# Patient Record
Sex: Female | Born: 1940 | Race: Black or African American | Hispanic: No | Smoking: Former smoker
Health system: Southern US, Community
[De-identification: ages and names within clinical notes are randomized; demographics above are authoritative.]

## PROBLEM LIST (undated history)

## (undated) DIAGNOSIS — F0390 Unspecified dementia without behavioral disturbance: Secondary | ICD-10-CM

## (undated) DIAGNOSIS — M67919 Unspecified disorder of synovium and tendon, unspecified shoulder: Secondary | ICD-10-CM

## (undated) DIAGNOSIS — R2681 Unsteadiness on feet: Secondary | ICD-10-CM

## (undated) DIAGNOSIS — M1990 Unspecified osteoarthritis, unspecified site: Secondary | ICD-10-CM

## (undated) DIAGNOSIS — E2839 Other primary ovarian failure: Secondary | ICD-10-CM

## (undated) DIAGNOSIS — G8929 Other chronic pain: Secondary | ICD-10-CM

## (undated) DIAGNOSIS — H209 Unspecified iridocyclitis: Secondary | ICD-10-CM

## (undated) DIAGNOSIS — I10 Essential (primary) hypertension: Secondary | ICD-10-CM

## (undated) DIAGNOSIS — E785 Hyperlipidemia, unspecified: Secondary | ICD-10-CM

## (undated) DIAGNOSIS — Z227 Latent tuberculosis: Secondary | ICD-10-CM

## (undated) HISTORY — DX: Unspecified disorder of synovium and tendon, unspecified shoulder: M67.919

## (undated) HISTORY — DX: Other chronic pain: G89.29

## (undated) HISTORY — PX: ABDOMINAL HYSTERECTOMY: SHX81

## (undated) HISTORY — DX: Unspecified dementia, unspecified severity, without behavioral disturbance, psychotic disturbance, mood disturbance, and anxiety: F03.90

## (undated) HISTORY — DX: Unsteadiness on feet: R26.81

## (undated) HISTORY — PX: WRIST SURGERY: SHX841

## (undated) HISTORY — DX: Essential (primary) hypertension: I10

## (undated) HISTORY — DX: Latent tuberculosis: Z22.7

## (undated) HISTORY — DX: Hyperlipidemia, unspecified: E78.5

## (undated) HISTORY — DX: Unspecified iridocyclitis: H20.9

## (undated) HISTORY — DX: Unspecified osteoarthritis, unspecified site: M19.90

## (undated) HISTORY — DX: Other primary ovarian failure: E28.39

---

## 2000-02-22 ENCOUNTER — Encounter: Payer: Self-pay | Admitting: *Deleted

## 2000-02-22 ENCOUNTER — Encounter: Admission: RE | Admit: 2000-02-22 | Discharge: 2000-02-22 | Payer: Self-pay | Admitting: *Deleted

## 2000-09-12 ENCOUNTER — Encounter: Payer: Self-pay | Admitting: *Deleted

## 2000-09-12 ENCOUNTER — Encounter: Admission: RE | Admit: 2000-09-12 | Discharge: 2000-09-12 | Payer: Self-pay | Admitting: *Deleted

## 2002-11-30 ENCOUNTER — Encounter: Payer: Self-pay | Admitting: Internal Medicine

## 2002-11-30 ENCOUNTER — Encounter: Admission: RE | Admit: 2002-11-30 | Discharge: 2002-11-30 | Payer: Self-pay | Admitting: Internal Medicine

## 2003-01-06 ENCOUNTER — Encounter: Admission: RE | Admit: 2003-01-06 | Discharge: 2003-04-06 | Payer: Self-pay | Admitting: Internal Medicine

## 2005-05-22 ENCOUNTER — Ambulatory Visit (HOSPITAL_COMMUNITY): Admission: RE | Admit: 2005-05-22 | Discharge: 2005-05-22 | Payer: Self-pay | Admitting: Gastroenterology

## 2008-02-05 ENCOUNTER — Ambulatory Visit (HOSPITAL_COMMUNITY): Admission: RE | Admit: 2008-02-05 | Discharge: 2008-02-05 | Payer: Self-pay | Admitting: Ophthalmology

## 2008-06-06 ENCOUNTER — Emergency Department (HOSPITAL_COMMUNITY): Admission: EM | Admit: 2008-06-06 | Discharge: 2008-06-06 | Payer: Self-pay | Admitting: Emergency Medicine

## 2009-01-05 ENCOUNTER — Emergency Department (HOSPITAL_COMMUNITY): Admission: EM | Admit: 2009-01-05 | Discharge: 2009-01-05 | Payer: Self-pay | Admitting: Family Medicine

## 2009-06-19 ENCOUNTER — Encounter: Admission: RE | Admit: 2009-06-19 | Discharge: 2009-06-19 | Payer: Self-pay | Admitting: Family Medicine

## 2010-07-25 ENCOUNTER — Encounter: Admission: RE | Admit: 2010-07-25 | Discharge: 2010-07-25 | Payer: Self-pay | Admitting: Internal Medicine

## 2011-01-13 ENCOUNTER — Encounter: Payer: Self-pay | Admitting: Internal Medicine

## 2011-05-10 NOTE — Op Note (Signed)
[name], Sheila Logan       ACCOUNT NO.:  [id number]   MEDICAL RECORD NO.:  [id number]          PATIENT TYPE:  AMB   LOCATION:  ENDO                         FACILITY:  [facility]   PHYSICIAN:  Anselmo Rod, M.D.  DATE OF BIRTH:  04-07-1941   DATE OF PROCEDURE:  05/22/2005  DATE OF DISCHARGE:                                 OPERATIVE REPORT   PROCEDURE PERFORMED:  Screening colonoscopy.   ENDOSCOPIST:  Charna Elizabeth, M.D.   INSTRUMENT USED:  Olympus video colonoscope.   INDICATIONS FOR PROCEDURE:  70 year old African-American female undergoing  screening colonoscopy.  Patient has had history of abdominal pain in the  recent past with worsening constipation and occasional bright red blood per  rectum.  Rule out colonic polyps, masses, etc.   PREPROCEDURE PREPARATION:  Informed consent was procured from the patient.  The patient was fasted for eight hours prior to the procedure and prepped  with a bottle of magnesium citrate and a gallon of GoLYTELY the night prior  to the procedure.  The risks and benefits of the procedure including a 10%  missed rate discussed with her as well.  ADDENDUM:  The patient had some difficulty tolerating the GoLYTELY prep and  therefore was given a bottle of MiraLax to take before the procedure.  Chest clear to auscultation.  S1 and S2 regular.  Abdomen soft with normal  bowel sounds.   DESCRIPTION OF PROCEDURE:  The patient was placed in left lateral decubitus  position and sedated with 50 mg of Demerol and 7.5 mg of Versed in slow  incremental doses.  Once the patient was adequately sedated and maintained  on low flow oxygen and continuous cardiac monitoring, the Olympus video  colonoscope was advanced from the rectum to the cecum.  The appendicular  orifice and ileocecal valve were clearly visualized and photographed.  There  was some residual stool in the colon and multiple washes were done.  No  masses, polyps, erosions, ulcerations.   IMPRESSION:  1.  Normal colonoscopy up to the cecum.  No masses, polyps or diverticula      seen.  2.  Small internal hemorrhoids seen on retroflexion in the rectum.   RECOMMENDATIONS:  Repeat colonoscopy has been recommended in the next 10  years.  Outpatient followup is advised in the next two weeks for further  work-up for abdominal pain.  A high fiber diet is to be followed along with  liberal fluid intake.      JNM/MEDQ  D:  05/22/2005  T:  05/22/2005  Job:  [id number]   cc:   Merlene Laughter. Renae Gloss, M.D.  [address]  [address]  [place]  [place] [address]  Fax: [phone]

## 2011-06-03 ENCOUNTER — Ambulatory Visit (INDEPENDENT_AMBULATORY_CARE_PROVIDER_SITE_OTHER): Payer: BC Managed Care – PPO

## 2011-06-03 ENCOUNTER — Inpatient Hospital Stay (INDEPENDENT_AMBULATORY_CARE_PROVIDER_SITE_OTHER)
Admission: RE | Admit: 2011-06-03 | Discharge: 2011-06-03 | Disposition: A | Discharge status: Home / Self Care | Payer: BC Managed Care – PPO | Source: Ambulatory Visit | Attending: Emergency Medicine | Admitting: Emergency Medicine

## 2011-06-03 DIAGNOSIS — S335XXA Sprain of ligaments of lumbar spine, initial encounter: Secondary | ICD-10-CM

## 2011-06-03 DIAGNOSIS — IMO0002 Reserved for concepts with insufficient information to code with codable children: Secondary | ICD-10-CM

## 2011-09-13 ENCOUNTER — Other Ambulatory Visit: Payer: Self-pay | Admitting: Internal Medicine

## 2011-09-13 DIAGNOSIS — M545 Low back pain: Secondary | ICD-10-CM

## 2011-09-13 LAB — BASIC METABOLIC PANEL
BUN: 8
CO2: 25
Calcium: 9
GFR calc non Af Amer: >60
Glucose, Bld: 95

## 2011-09-13 LAB — KETONES, QUALITATIVE: Acetone, Bld: NEGATIVE

## 2011-09-13 LAB — RHEUMATOID FACTOR: Rhuematoid fact SerPl-aCnc: <20

## 2011-09-18 ENCOUNTER — Ambulatory Visit
Admission: RE | Admit: 2011-09-18 | Discharge: 2011-09-18 | Disposition: A | Discharge status: Home / Self Care | Payer: BC Managed Care – PPO | Source: Ambulatory Visit | Attending: Internal Medicine | Admitting: Internal Medicine

## 2011-09-18 DIAGNOSIS — M545 Low back pain: Secondary | ICD-10-CM

## 2011-12-04 ENCOUNTER — Other Ambulatory Visit: Payer: Self-pay | Admitting: Neurology

## 2011-12-04 DIAGNOSIS — M542 Cervicalgia: Secondary | ICD-10-CM

## 2011-12-04 DIAGNOSIS — R202 Paresthesia of skin: Secondary | ICD-10-CM

## 2012-01-06 ENCOUNTER — Inpatient Hospital Stay: Admission: RE | Admit: 2012-01-06 | Payer: Self-pay | Source: Ambulatory Visit

## 2012-01-09 ENCOUNTER — Ambulatory Visit
Admission: RE | Admit: 2012-01-09 | Discharge: 2012-01-09 | Disposition: A | Discharge status: Home / Self Care | Payer: BC Managed Care – PPO | Source: Ambulatory Visit | Attending: Neurology | Admitting: Neurology

## 2012-01-09 DIAGNOSIS — M542 Cervicalgia: Secondary | ICD-10-CM

## 2012-01-09 DIAGNOSIS — R202 Paresthesia of skin: Secondary | ICD-10-CM

## 2012-11-03 ENCOUNTER — Other Ambulatory Visit: Payer: Self-pay | Admitting: Internal Medicine

## 2012-11-03 DIAGNOSIS — Z1231 Encounter for screening mammogram for malignant neoplasm of breast: Secondary | ICD-10-CM

## 2012-12-14 ENCOUNTER — Ambulatory Visit
Admission: RE | Admit: 2012-12-14 | Discharge: 2012-12-14 | Disposition: A | Discharge status: Home / Self Care | Payer: Medicare Other | Source: Ambulatory Visit | Attending: Internal Medicine | Admitting: Internal Medicine

## 2012-12-14 DIAGNOSIS — Z1231 Encounter for screening mammogram for malignant neoplasm of breast: Secondary | ICD-10-CM

## 2013-11-29 ENCOUNTER — Other Ambulatory Visit: Payer: Self-pay

## 2013-11-29 DIAGNOSIS — Z1231 Encounter for screening mammogram for malignant neoplasm of breast: Secondary | ICD-10-CM

## 2013-12-30 ENCOUNTER — Ambulatory Visit
Admission: RE | Admit: 2013-12-30 | Discharge: 2013-12-30 | Disposition: A | Discharge status: Home / Self Care | Payer: Medicare Other | Source: Ambulatory Visit

## 2013-12-30 DIAGNOSIS — Z1231 Encounter for screening mammogram for malignant neoplasm of breast: Secondary | ICD-10-CM

## 2014-07-07 ENCOUNTER — Other Ambulatory Visit: Payer: Self-pay | Admitting: Physical Medicine and Rehabilitation

## 2014-07-07 DIAGNOSIS — M5412 Radiculopathy, cervical region: Secondary | ICD-10-CM

## 2014-07-07 DIAGNOSIS — M5137 Other intervertebral disc degeneration, lumbosacral region: Secondary | ICD-10-CM

## 2014-07-07 DIAGNOSIS — M542 Cervicalgia: Secondary | ICD-10-CM

## 2014-07-14 ENCOUNTER — Other Ambulatory Visit: Payer: Self-pay

## 2014-07-20 ENCOUNTER — Other Ambulatory Visit: Payer: Self-pay

## 2014-07-21 ENCOUNTER — Ambulatory Visit
Admission: RE | Admit: 2014-07-21 | Discharge: 2014-07-21 | Disposition: A | Discharge status: Home / Self Care | Payer: Medicare Other | Source: Ambulatory Visit | Attending: Physical Medicine and Rehabilitation | Admitting: Physical Medicine and Rehabilitation

## 2014-07-21 DIAGNOSIS — M5137 Other intervertebral disc degeneration, lumbosacral region: Secondary | ICD-10-CM

## 2014-07-21 DIAGNOSIS — M5412 Radiculopathy, cervical region: Secondary | ICD-10-CM

## 2014-07-21 DIAGNOSIS — M542 Cervicalgia: Secondary | ICD-10-CM

## 2015-01-31 ENCOUNTER — Encounter: Payer: Self-pay | Admitting: Infectious Diseases

## 2015-01-31 ENCOUNTER — Ambulatory Visit (INDEPENDENT_AMBULATORY_CARE_PROVIDER_SITE_OTHER): Payer: Medicare Other | Admitting: Infectious Diseases

## 2015-01-31 VITALS — BP 150/85 | HR 57 | Temp 97.6°F | Wt 175.0 lb

## 2015-01-31 DIAGNOSIS — Z227 Latent tuberculosis: Secondary | ICD-10-CM | POA: Insufficient documentation

## 2015-01-31 DIAGNOSIS — R7611 Nonspecific reaction to tuberculin skin test without active tuberculosis: Secondary | ICD-10-CM

## 2015-01-31 DIAGNOSIS — I10 Essential (primary) hypertension: Secondary | ICD-10-CM

## 2015-01-31 NOTE — Assessment & Plan Note (Signed)
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 She adamantly refused this. I explained she has a roughly 10% risk of developing TB over the course of her life if she does not receive any immunosuppresants.  Will obtain a copy of her CXR.  The hx of her uveitis/eye problems are not pathognomonic for TB. Without an active Cx or evidence that TB is causing this, I am unable to convince her to take treatment.  She will call me back if she has fever, night sweats, chills, weight loss, lymphadenopathy, cough, shortness of breath.

## 2015-01-31 NOTE — Progress Notes (Signed)
   Subjective:    Patient ID: Sheila Logan, female    DOB: [birth year], 74 y.o.   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 She had no systemic symptoms. She had a CXR that was (-), not available here.  No TB exposures, no FHx. No hx of homelessnees, no incarcerations. [place] been [place] seas [place]. Elementary school teacher for 34 yrs.   FHx/Sochx- reviewed, updated.   Review of Systems  Constitutional: Negative for fever, chills, appetite change and unexpected weight change.  Respiratory: Negative for cough and shortness of breath.   Cardiovascular: Positive for leg swelling.  Gastrointestinal: Negative for diarrhea and constipation.  Genitourinary: Negative for difficulty urinating.  Hematological: Negative for adenopathy.      Objective:   Physical Exam  Constitutional: She appears well-developed and well-nourished.  HENT:  Mouth/Throat: No oropharyngeal exudate.  Eyes: EOM are normal. Pupils are equal, round, and reactive to light.  Neck: Neck supple.  Cardiovascular: Normal rate, regular rhythm and normal heart sounds.   Pulmonary/Chest: Effort normal and breath sounds normal.  Abdominal: Soft. Bowel sounds are normal. She exhibits no distension. There is no tenderness.  Musculoskeletal: She exhibits edema.  Lymphadenopathy:    She has no cervical adenopathy.    She has no axillary adenopathy.          Assessment & Plan:

## 2015-08-14 ENCOUNTER — Other Ambulatory Visit: Payer: Self-pay | Admitting: Internal Medicine

## 2015-08-14 DIAGNOSIS — E2839 Other primary ovarian failure: Secondary | ICD-10-CM

## 2015-08-22 ENCOUNTER — Other Ambulatory Visit: Payer: Self-pay

## 2015-08-22 DIAGNOSIS — Z1231 Encounter for screening mammogram for malignant neoplasm of breast: Secondary | ICD-10-CM

## 2015-09-22 ENCOUNTER — Ambulatory Visit: Payer: Medicare Other

## 2015-09-22 ENCOUNTER — Other Ambulatory Visit: Payer: Medicare Other

## 2015-10-02 ENCOUNTER — Other Ambulatory Visit: Payer: Medicare Other

## 2015-10-02 ENCOUNTER — Ambulatory Visit: Payer: Medicare Other

## 2015-10-12 ENCOUNTER — Ambulatory Visit
Admission: RE | Admit: 2015-10-12 | Discharge: 2015-10-12 | Disposition: A | Discharge status: Home / Self Care | Payer: Medicare Other | Source: Ambulatory Visit | Attending: Internal Medicine | Admitting: Internal Medicine

## 2015-10-12 ENCOUNTER — Ambulatory Visit
Admission: RE | Admit: 2015-10-12 | Discharge: 2015-10-12 | Disposition: A | Discharge status: Home / Self Care | Payer: Medicare Other | Source: Ambulatory Visit

## 2015-10-12 DIAGNOSIS — E2839 Other primary ovarian failure: Secondary | ICD-10-CM

## 2015-10-12 DIAGNOSIS — Z1231 Encounter for screening mammogram for malignant neoplasm of breast: Secondary | ICD-10-CM

## 2017-03-11 ENCOUNTER — Emergency Department (HOSPITAL_COMMUNITY)
Admission: EM | Admit: 2017-03-11 | Discharge: 2017-03-11 | Disposition: A | Discharge status: Home / Self Care | Payer: Medicare Other | Attending: Emergency Medicine | Admitting: Emergency Medicine

## 2017-03-11 ENCOUNTER — Encounter (HOSPITAL_COMMUNITY): Payer: Self-pay | Admitting: *Deleted

## 2017-03-11 DIAGNOSIS — Z79899 Other long term (current) drug therapy: Secondary | ICD-10-CM | POA: Diagnosis not present

## 2017-03-11 DIAGNOSIS — R55 Syncope and collapse: Secondary | ICD-10-CM | POA: Diagnosis not present

## 2017-03-11 DIAGNOSIS — Z7982 Long term (current) use of aspirin: Secondary | ICD-10-CM | POA: Diagnosis not present

## 2017-03-11 DIAGNOSIS — Z87891 Personal history of nicotine dependence: Secondary | ICD-10-CM | POA: Insufficient documentation

## 2017-03-11 DIAGNOSIS — I10 Essential (primary) hypertension: Secondary | ICD-10-CM | POA: Insufficient documentation

## 2017-03-11 HISTORY — DX: Essential (primary) hypertension: I10

## 2017-03-11 LAB — CBC
HEMATOCRIT: 37.2 % (ref 36.0–46.0)
Hemoglobin: 12.5 g/dL (ref 12.0–15.0)
MCH: 28.6 pg (ref 26.0–34.0)
MCHC: 33.6 g/dL (ref 30.0–36.0)
MCV: 85.1 fL (ref 78.0–100.0)
Platelets: 223 10*3/uL (ref 150–400)
RBC: 4.37 MIL/uL (ref 3.87–5.11)
RDW: 16.6 % — AB (ref 11.5–15.5)
WBC: 9 10*3/uL (ref 4.0–10.5)

## 2017-03-11 LAB — BASIC METABOLIC PANEL
Anion gap: 9 (ref 5–15)
BUN: 20 mg/dL (ref 6–20)
CALCIUM: 8 mg/dL — AB (ref 8.9–10.3)
CO2: 22 mmol/L (ref 22–32)
Chloride: 109 mmol/L (ref 101–111)
Creatinine, Ser: 0.96 mg/dL (ref 0.44–1.00)
GFR calc Af Amer: >60 mL/min (ref >60)
GFR, EST NON AFRICAN AMERICAN: 57 mL/min — AB (ref >60)
GLUCOSE: 83 mg/dL (ref 65–99)
Potassium: 2.9 mmol/L — ABNORMAL LOW (ref 3.5–5.1)
Sodium: 140 mmol/L (ref 135–145)

## 2017-03-11 LAB — URINALYSIS, ROUTINE W REFLEX MICROSCOPIC
Bacteria, UA: NONE SEEN
Bilirubin Urine: NEGATIVE
GLUCOSE, UA: NEGATIVE mg/dL
Hgb urine dipstick: NEGATIVE
KETONES UR: 5 mg/dL — AB
NITRITE: NEGATIVE
PH: 7 (ref 5.0–8.0)
PROTEIN: NEGATIVE mg/dL
Specific Gravity, Urine: 1.011 (ref 1.005–1.030)

## 2017-03-11 LAB — TROPONIN I: Troponin I: <0.03 ng/mL (ref <0.03)

## 2017-03-11 LAB — CBG MONITORING, ED: Glucose-Capillary: 71 mg/dL (ref 65–99)

## 2017-03-11 MED ORDER — SODIUM CHLORIDE 0.9 % IV SOLN
INTRAVENOUS | Status: DC
  Administered 2017-03-11: 21:00:00 via INTRAVENOUS

## 2017-03-11 MED ORDER — SODIUM CHLORIDE 0.9 % IV BOLUS (SEPSIS)
1000.0000 mL | Freq: Once | INTRAVENOUS | Status: AC
  Administered 2017-03-11: 1000 mL via INTRAVENOUS

## 2017-03-11 NOTE — ED Triage Notes (Signed)
Per EMS, pt from had 3-4 syncopal episodes while at [employer] today. Pt was sitting upright in chair upon EMS arrival. Pt had 82 systolic while sitting, 94/50 laying down. Pt's BP is 97/53 upon arrival to hospital . Pt does not have complaints at this time. Pt NSR on monitor.

## 2017-03-11 NOTE — ED Notes (Signed)
Provided patient cranberry juice and family member drink.

## 2017-03-11 NOTE — ED Notes (Signed)
Spoke with main lab, lab is going to try to use for lab order of troponin.

## 2017-03-11 NOTE — ED Provider Notes (Signed)
[facility]-EMERGENCY DEPT Provider Note   CSN: [id number] Arrival date & time: 03/11/17  1824     History   Chief Complaint Chief Complaint  Patient presents with  . Loss of Consciousness    HPI Sheila Logan is a 76 y.o. female.  76 year old female who had a syncopal event while sitting down the chair. She was at a [employer] meeting and states that she has only had popcorn and an egg all day long. States that she became lightheaded and dizzy and then passed out. Denies any chest pain, shortness of breath, palpitations prior to the event. eMS was called and patient had a blood pressure of 82 systolic when she was sitting and a blood pressure of 94/50 when she was lying down. She denies any recent volume loss. No recent fever or chills. Patient was transferred here for further management.      Past Medical History:  Diagnosis Date  . LTBI (latent tuberculosis infection)   . Uveitis     Patient Active Problem List   Diagnosis Date Noted  . LTBI (latent tuberculosis infection) 01/31/2015  . HTN (hypertension) 01/31/2015    History reviewed. No pertinent surgical history.  OB History    No data available       Home Medications    Prior to Admission medications   Medication Sig Start Date End Date Taking? Authorizing Provider  aspirin 81 MG tablet Take 81 mg by mouth daily.    Historical Provider, MD  cholecalciferol (VITAMIN D) 1000 UNITS tablet Take 1,000 Units by mouth daily.    Historical Provider, MD  cloNIDine (CATAPRES - DOSED IN MG/24 HR) 0.1 mg/24hr patch Place 0.1 mg onto the skin once a week.    Historical Provider, MD  fexofenadine (ALLEGRA) 180 MG tablet Take 180 mg by mouth daily.    Historical Provider, MD  Flaxseed, Linseed, (FLAXSEED OIL) 1000 MG CAPS Take 1 capsule by mouth daily.    Historical Provider, MD  furosemide (LASIX) 40 MG tablet Take 40 mg by mouth daily.    Historical Provider, MD  lisinopril-hydrochlorothiazide (PRINZIDE,ZESTORETIC)  20-25 MG per tablet Take 1 tablet by mouth daily.    Historical Provider, MD  Multiple Vitamins-Minerals (MULTIVITAMIN ADULT PO) Take by mouth.    Historical Provider, MD  omega-3 acid ethyl esters (LOVAZA) 1 G capsule Take 1 g by mouth daily.    Historical Provider, MD  potassium chloride (K-DUR) 10 MEQ tablet Take 10 mEq by mouth daily. Unsure of dose    Historical Provider, MD  tobramycin-dexamethasone [facility]) ophthalmic ointment Place 1 application into both eyes 3 (three) times daily.    Historical Provider, MD    Family History Family History  Problem Relation Age of Onset  . Diabetes Mother   . Prostate cancer Father   . Heart attack Paternal Grandmother     Social History Social History  Substance Use Topics  . Smoking status: Former Smoker    Quit date: 12/24/1971  . Smokeless tobacco: Never Used  . Alcohol use No     Allergies   Patient has no known allergies.   Review of Systems Review of Systems  All other systems reviewed and are negative.    Physical Exam Updated Vital Signs BP 129/72   Pulse 99   Temp 97.7 F (36.5 C) (Oral)   Resp 16   SpO2 99%   Physical Exam  Constitutional: She is oriented to person, place, and time. She appears well-developed and well-nourished.  Non-toxic appearance.  No distress.  HENT:  Head: Normocephalic and atraumatic.  Eyes: Conjunctivae, EOM and lids are normal. Pupils are equal, round, and reactive to light.  Neck: Normal range of motion. Neck supple. No tracheal deviation present. No thyroid mass present.  Cardiovascular: Normal rate, regular rhythm and normal heart sounds.  Exam reveals no gallop.   No murmur heard. Pulmonary/Chest: Effort normal and breath sounds normal. No stridor. No respiratory distress. She has no decreased breath sounds. She has no wheezes. She has no rhonchi. She has no rales.  Abdominal: Soft. Normal appearance and bowel sounds are normal. She exhibits no distension. There is no tenderness.  There is no rebound and no CVA tenderness.  Musculoskeletal: Normal range of motion. She exhibits no edema or tenderness.  Neurological: She is alert and oriented to person, place, and time. She has normal strength. No cranial nerve deficit or sensory deficit. GCS eye subscore is 4. GCS verbal subscore is 5. GCS motor subscore is 6.  Skin: Skin is warm and dry. No abrasion and no rash noted.  Psychiatric: She has a normal mood and affect. Her speech is normal and behavior is normal.  Nursing note and vitals reviewed.    ED Treatments / Results  Labs (all labs ordered are listed, but only abnormal results are displayed) Labs Reviewed  BASIC METABOLIC PANEL  CBC  URINALYSIS, ROUTINE W REFLEX MICROSCOPIC  CBG MONITORING, ED    EKG  EKG Interpretation  Date/Time:  Tuesday March 11 2017 18:53:28 EDT Ventricular Rate:  59 PR Interval:  196 QRS Duration: 78 QT Interval:  432 QTC Calculation: 427 R Axis:   165 Text Interpretation:  Sinus bradycardia Right axis deviation Possible Right ventricular hypertrophy Abnormal ECG Confirmed by Idora Brosious  MD, Cheryle Dark ([id number]) on 03/11/2017 7:22:51 PM       Radiology No results found.  Procedures Procedures (including critical care time)  Medications Ordered in ED Medications  sodium chloride 0.9 % bolus 1,000 mL (not administered)  0.9 %  sodium chloride infusion (not administered)     Initial Impression / Assessment and Plan / ED Course  I have reviewed the triage vital signs and the nursing notes.  Pertinent labs & imaging results that were available during my care of the patient were reviewed by me and considered in my medical decision making (see chart for details).     Patient given IV fluids here feels better. Suspect that she is dehydrated. Given potassium orally and does feel better. We'll discharge home with return precautions  Final Clinical Impressions(s) / ED Diagnoses   Final diagnoses:  None    New  Prescriptions New Prescriptions   No medications on file     Lorre NickAnthony Catherine Cubero, MD 03/11/17 2224

## 2017-03-11 NOTE — ED Notes (Signed)
Assisted patient to the restroom and back to stretcher. Pt tolerated it well with a steady gait.

## 2017-03-11 NOTE — ED Notes (Signed)
Attempted to draw blood from prior IV and another stick on the left hand with no success. Asked Johny Drillinghan, NT to assist with blood draw. Also, pt is aware of urine sample needed but unable to obtain a specimen at this time.

## 2017-03-18 ENCOUNTER — Other Ambulatory Visit: Payer: Self-pay | Admitting: Internal Medicine

## 2017-03-18 DIAGNOSIS — R52 Pain, unspecified: Secondary | ICD-10-CM

## 2017-03-27 ENCOUNTER — Ambulatory Visit
Admission: RE | Admit: 2017-03-27 | Discharge: 2017-03-27 | Disposition: A | Discharge status: Home / Self Care | Payer: Medicare Other | Source: Ambulatory Visit | Attending: Internal Medicine | Admitting: Internal Medicine

## 2017-03-27 DIAGNOSIS — R52 Pain, unspecified: Secondary | ICD-10-CM

## 2017-04-04 ENCOUNTER — Encounter (HOSPITAL_COMMUNITY): Payer: Self-pay

## 2017-04-04 ENCOUNTER — Observation Stay (HOSPITAL_COMMUNITY)
Admission: EM | Admit: 2017-04-04 | Discharge: 2017-04-06 | Disposition: A | Discharge status: Home / Self Care | Payer: Medicare Other | Attending: Internal Medicine | Admitting: Internal Medicine

## 2017-04-04 DIAGNOSIS — I951 Orthostatic hypotension: Principal | ICD-10-CM | POA: Diagnosis present

## 2017-04-04 DIAGNOSIS — R55 Syncope and collapse: Secondary | ICD-10-CM | POA: Diagnosis present

## 2017-04-04 DIAGNOSIS — I10 Essential (primary) hypertension: Secondary | ICD-10-CM | POA: Insufficient documentation

## 2017-04-04 DIAGNOSIS — Z79899 Other long term (current) drug therapy: Secondary | ICD-10-CM | POA: Insufficient documentation

## 2017-04-04 DIAGNOSIS — R600 Localized edema: Secondary | ICD-10-CM | POA: Insufficient documentation

## 2017-04-04 DIAGNOSIS — N390 Urinary tract infection, site not specified: Secondary | ICD-10-CM | POA: Diagnosis present

## 2017-04-04 DIAGNOSIS — E860 Dehydration: Secondary | ICD-10-CM | POA: Insufficient documentation

## 2017-04-04 DIAGNOSIS — Z87891 Personal history of nicotine dependence: Secondary | ICD-10-CM | POA: Insufficient documentation

## 2017-04-04 DIAGNOSIS — L282 Other prurigo: Secondary | ICD-10-CM | POA: Diagnosis present

## 2017-04-04 DIAGNOSIS — Z7982 Long term (current) use of aspirin: Secondary | ICD-10-CM | POA: Insufficient documentation

## 2017-04-04 DIAGNOSIS — K219 Gastro-esophageal reflux disease without esophagitis: Secondary | ICD-10-CM | POA: Insufficient documentation

## 2017-04-04 DIAGNOSIS — L509 Urticaria, unspecified: Secondary | ICD-10-CM | POA: Insufficient documentation

## 2017-04-04 DIAGNOSIS — R5383 Other fatigue: Secondary | ICD-10-CM

## 2017-04-04 DIAGNOSIS — R0989 Other specified symptoms and signs involving the circulatory and respiratory systems: Secondary | ICD-10-CM

## 2017-04-04 LAB — COMPREHENSIVE METABOLIC PANEL
ALK PHOS: 56 U/L (ref 38–126)
ALT: 21 U/L (ref 14–54)
AST: 30 U/L (ref 15–41)
Albumin: 3.5 g/dL (ref 3.5–5.0)
Anion gap: 10 (ref 5–15)
BUN: 20 mg/dL (ref 6–20)
CALCIUM: 8.7 mg/dL — AB (ref 8.9–10.3)
CO2: 24 mmol/L (ref 22–32)
CREATININE: 0.96 mg/dL (ref 0.44–1.00)
Chloride: 102 mmol/L (ref 101–111)
GFR, EST NON AFRICAN AMERICAN: 57 mL/min — AB (ref >60)
Glucose, Bld: 148 mg/dL — ABNORMAL HIGH (ref 65–99)
Potassium: 3.6 mmol/L (ref 3.5–5.1)
Sodium: 136 mmol/L (ref 135–145)
Total Bilirubin: 0.6 mg/dL (ref 0.3–1.2)
Total Protein: 6.5 g/dL (ref 6.5–8.1)

## 2017-04-04 LAB — CBC WITH DIFFERENTIAL/PLATELET
Basophils Absolute: 0 10*3/uL (ref 0.0–0.1)
Basophils Relative: 0 %
Eosinophils Absolute: 0.1 10*3/uL (ref 0.0–0.7)
Eosinophils Relative: 1 %
HCT: 43.2 % (ref 36.0–46.0)
HEMOGLOBIN: 14.5 g/dL (ref 12.0–15.0)
LYMPHS PCT: 23 %
Lymphs Abs: 1.4 10*3/uL (ref 0.7–4.0)
MCH: 28.7 pg (ref 26.0–34.0)
MCHC: 33.6 g/dL (ref 30.0–36.0)
MCV: 85.4 fL (ref 78.0–100.0)
Monocytes Absolute: 0.4 10*3/uL (ref 0.1–1.0)
Monocytes Relative: 6 %
NEUTROS ABS: 4.3 10*3/uL (ref 1.7–7.7)
NEUTROS PCT: 70 %
Platelets: 307 10*3/uL (ref 150–400)
RBC: 5.06 MIL/uL (ref 3.87–5.11)
RDW: 16 % — ABNORMAL HIGH (ref 11.5–15.5)
WBC: 6.2 10*3/uL (ref 4.0–10.5)

## 2017-04-04 LAB — URINALYSIS, ROUTINE W REFLEX MICROSCOPIC
Bilirubin Urine: NEGATIVE
Glucose, UA: NEGATIVE mg/dL
HGB URINE DIPSTICK: NEGATIVE
Ketones, ur: NEGATIVE mg/dL
NITRITE: NEGATIVE
PROTEIN: NEGATIVE mg/dL
SPECIFIC GRAVITY, URINE: 1.016 (ref 1.005–1.030)
pH: 6 (ref 5.0–8.0)

## 2017-04-04 LAB — I-STAT TROPONIN, ED: TROPONIN I, POC: 0 ng/mL (ref 0.00–0.08)

## 2017-04-04 LAB — CBG MONITORING, ED: Glucose-Capillary: 129 mg/dL — ABNORMAL HIGH (ref 65–99)

## 2017-04-04 MED ORDER — CEPHALEXIN 500 MG PO CAPS
500.0000 mg | ORAL_CAPSULE | Freq: Once | ORAL | Status: AC
  Administered 2017-04-04: 500 mg via ORAL
  Filled 2017-04-04: qty 1

## 2017-04-04 MED ORDER — CEPHALEXIN 500 MG PO CAPS: 500.0000 mg | ORAL_CAPSULE | Freq: Three times a day (TID) | ORAL | 0 refills | Status: DC

## 2017-04-04 MED ORDER — OMEPRAZOLE 20 MG PO CPDR: 20.0000 mg | DELAYED_RELEASE_CAPSULE | Freq: Every day | ORAL | 0 refills | Status: DC

## 2017-04-04 MED ORDER — GI COCKTAIL ~~LOC~~
30.0000 mL | Freq: Once | ORAL | Status: AC
  Administered 2017-04-04: 30 mL via ORAL
  Filled 2017-04-04: qty 30

## 2017-04-04 MED ORDER — SODIUM CHLORIDE 0.9 % IV BOLUS (SEPSIS)
1000.0000 mL | Freq: Once | INTRAVENOUS | Status: AC
  Administered 2017-04-04: 1000 mL via INTRAVENOUS

## 2017-04-04 MED ORDER — ONDANSETRON HCL 4 MG/2ML IJ SOLN
4.0000 mg | Freq: Once | INTRAMUSCULAR | Status: AC
  Administered 2017-04-04: 4 mg via INTRAVENOUS
  Filled 2017-04-04: qty 2

## 2017-04-04 NOTE — ED Notes (Signed)
Bed: [facility] Expected date:  Expected time:  Means of arrival:  Comments: 76 yo F  Near syncope

## 2017-04-04 NOTE — ED Notes (Signed)
Ambulated pt to the bathroom with 1 assist.  Pt. Stated that she felt "dizzy again".

## 2017-04-04 NOTE — ED Provider Notes (Signed)
[facility]-EMERGENCY DEPT Provider Note   CSN: [id number] Arrival date & time: 04/04/17  1928     History   Chief Complaint Chief Complaint  Patient presents with  . Near Syncope    HPI Sheila Logan is a 76 y.o. female.  The history is provided by the patient.  Near Syncope  This is a new problem. The current episode started 6 to 12 hours ago. The problem occurs constantly. The problem has not changed since onset.Pertinent negatives include no chest pain, no abdominal pain, no headaches and no shortness of breath. Associated symptoms comments: Facial flushing, lightheadedness, then feels weak and like she might pass out. Urinary frequency last several months. Nothing aggravates the symptoms. Nothing relieves the symptoms. She has tried nothing for the symptoms.    Past Medical History:  Diagnosis Date  . Hypertension   . LTBI (latent tuberculosis infection)   . Uveitis     Patient Active Problem List   Diagnosis Date Noted  . LTBI (latent tuberculosis infection) 01/31/2015  . HTN (hypertension) 01/31/2015    Past Surgical History:  Procedure Laterality Date  . WRIST SURGERY Right     OB History    No data available       Home Medications    Prior to Admission medications   Medication Sig Start Date End Date Taking? Authorizing Provider  Artificial Tear Ointment (ARTIFICIAL TEARS) ointment Place 1 drop into both eyes as needed (dry eyes).    Historical Provider, MD  aspirin 81 MG tablet Take 81 mg by mouth daily.    Historical Provider, MD  cholecalciferol (VITAMIN D) 1000 UNITS tablet Take 1,000 Units by mouth daily.    Historical Provider, MD  Flaxseed, Linseed, (FLAXSEED OIL) 1000 MG CAPS Take 1 capsule by mouth daily.    Historical Provider, MD  furosemide (LASIX) 40 MG tablet Take 40 mg by mouth daily.    Historical Provider, MD  lisinopril-hydrochlorothiazide (PRINZIDE,ZESTORETIC) 20-25 MG per tablet Take 1 tablet by mouth daily.    Historical Provider,  MD  magnesium oxide (MAG-OX) 400 MG tablet Take 400 mg by mouth daily.    Historical Provider, MD  omega-3 acid ethyl esters (LOVAZA) 1 G capsule Take 1 g by mouth daily.    Historical Provider, MD  POTASSIUM PO Take 1 tablet by mouth daily.    Historical Provider, MD  vitamin B-12 (CYANOCOBALAMIN) 100 MCG tablet Take 100 mcg by mouth daily.    Historical Provider, MD  vitamin C (ASCORBIC ACID) 250 MG tablet Take 250 mg by mouth daily.    Historical Provider, MD    Family History Family History  Problem Relation Age of Onset  . Diabetes Mother   . Prostate cancer Father   . Heart attack Paternal Grandmother     Social History Social History  Substance Use Topics  . Smoking status: Former Smoker    Quit date: 12/24/1971  . Smokeless tobacco: Never Used  . Alcohol use No     Allergies   Patient has no known allergies.   Review of Systems Review of Systems  Respiratory: Negative for shortness of breath.   Cardiovascular: Positive for near-syncope. Negative for chest pain.  Gastrointestinal: Negative for abdominal pain.  Neurological: Negative for headaches.  All other systems reviewed and are negative.    Physical Exam Updated Vital Signs BP 139/82 (BP Location: Left Arm)   Pulse 63   Temp 97.7 F (36.5 C) (Oral)   Resp 18   SpO2 99%  Physical Exam  Constitutional: She is oriented to person, place, and time. She appears well-developed and well-nourished. No distress.  HENT:  Head: Normocephalic.  Nose: Nose normal.  Eyes: Conjunctivae are normal.  Neck: Normal range of motion. Neck supple. No tracheal deviation present.  Cardiovascular: Normal rate, regular rhythm and normal heart sounds.   Pulmonary/Chest: Effort normal and breath sounds normal. No respiratory distress. She has no wheezes.  Abdominal: Soft. She exhibits no distension. There is no tenderness. There is no rebound and no guarding.  Neurological: She is alert and oriented to person, place, and  time.  Skin: Skin is warm and dry.  Psychiatric: She has a normal mood and affect.  Vitals reviewed.    ED Treatments / Results  Labs (all labs ordered are listed, but only abnormal results are displayed) Labs Reviewed  CBC WITH DIFFERENTIAL/PLATELET - Abnormal; Notable for the following:       Result Value   RDW 16.0 (*)    All other components within normal limits  COMPREHENSIVE METABOLIC PANEL - Abnormal; Notable for the following:    Glucose, Bld 148 (*)    Calcium 8.7 (*)    GFR calc non Af Amer 57 (*)    All other components within normal limits  URINALYSIS, ROUTINE W REFLEX MICROSCOPIC - Abnormal; Notable for the following:    APPearance HAZY (*)    Leukocytes, UA LARGE (*)    Bacteria, UA RARE (*)    Squamous Epithelial / LPF 0-5 (*)    All other components within normal limits  CBG MONITORING, ED - Abnormal; Notable for the following:    Glucose-Capillary 129 (*)    All other components within normal limits  URINE CULTURE  I-STAT TROPOININ, ED    EKG  EKG Interpretation  Date/Time:  Friday April 04 2017 20:21:05 EDT Ventricular Rate:  70 PR Interval:    QRS Duration: 84 QT Interval:  420 QTC Calculation: 454 R Axis:   -91 Text Interpretation:  Sinus rhythm Left anterior fascicular block Abnormal R-wave progression, late transition No significant change since last tracing Confirmed by Atley Scarboro MD, Billy Rocco ([id number]) on 04/04/2017 8:42:13 PM       Radiology No results found.  Procedures Procedures (including critical care time)  Medications Ordered in ED Medications  sodium chloride 0.9 % bolus 500 mL (not administered)  sodium chloride 0.9 % bolus 1,000 mL (0 mLs Intravenous Stopped 04/04/17 2315)  cephALEXin (KEFLEX) capsule 500 mg (500 mg Oral Given 04/04/17 2312)  ondansetron (ZOFRAN) injection 4 mg (4 mg Intravenous Given 04/04/17 2313)  gi cocktail (Maalox,Lidocaine,Donnatal) (30 mLs Oral Given 04/04/17 2313)     Initial Impression / Assessment and  Plan / ED Course  I have reviewed the triage vital signs and the nursing notes.  Pertinent labs & imaging results that were available during my care of the patient were reviewed by me and considered in my medical decision making (see chart for details).     76 y.o. female presents with feeling of near-syncope earlier today at the hair salon which resolved. She got flushed and lightheaded but did not lose consciousness. She had a repeat episode at home. EKG interpreted by me without ST or T wave changes concerning for myocardial ischemia. No delta wave, no prolonged QTc, no brugada to suggest arrhythmogenicity. Troponin negative. Pt not orthostatic per nurse when vitals performed. No significant hematologic or metabolic abnormalities to explain symptoms. IVF administered and Pt feeling better. Having some GI upset c/w GERD improved  by GI cocktail. Planned treatment of persistent UTI after failing ABx therapy on what is presumed to be TMP-SMX but patient unsure, has not been on cephalosporin to her knowledge. Will start on PPI. Planned discharge but apparently became orthostatic and could not ambulate safely, agree with admission for fluids and monitoring.   Final Clinical Impressions(s) / ED Diagnoses   Final diagnoses:  Near syncope  Urinary tract infection without hematuria, site unspecified  Gastroesophageal reflux disease without esophagitis  Globus sensation    New Prescriptions New Prescriptions   CEPHALEXIN (KEFLEX) 500 MG CAPSULE    Take 1 capsule (500 mg total) by mouth 3 (three) times daily.   OMEPRAZOLE (PRILOSEC) 20 MG CAPSULE    Take 1 capsule (20 mg total) by mouth daily. 30 minutes before breakfast     Lyndal Pulley, MD 04/05/17 0225

## 2017-04-04 NOTE — ED Triage Notes (Signed)
Per EMS, pt had 2 syncopal episodes while getting hair done.  Pt was positive orthostatic changes. Laying: BP140/88 HR62 Sitting: BP120/60 HR86  Standing: BP118/42 HR90. Pt's BP upon arrival was 139/42. Pt.has hives over lower back, both arms, and around neck.  Pt states she fell 3 weeks ago and hit her head... No blood thinners.  Pt NSR on monitor.

## 2017-04-05 ENCOUNTER — Observation Stay (HOSPITAL_COMMUNITY): Payer: Medicare Other

## 2017-04-05 DIAGNOSIS — N390 Urinary tract infection, site not specified: Secondary | ICD-10-CM | POA: Diagnosis not present

## 2017-04-05 DIAGNOSIS — L282 Other prurigo: Secondary | ICD-10-CM | POA: Diagnosis present

## 2017-04-05 DIAGNOSIS — I951 Orthostatic hypotension: Secondary | ICD-10-CM | POA: Diagnosis not present

## 2017-04-05 DIAGNOSIS — E860 Dehydration: Secondary | ICD-10-CM

## 2017-04-05 DIAGNOSIS — R55 Syncope and collapse: Secondary | ICD-10-CM | POA: Diagnosis not present

## 2017-04-05 LAB — C-REACTIVE PROTEIN: CRP: 3 mg/dL — ABNORMAL HIGH (ref <1.0)

## 2017-04-05 LAB — RPR: RPR Ser Ql: NONREACTIVE

## 2017-04-05 LAB — CORTISOL-AM, BLOOD: Cortisol - AM: 13.1 ug/dL (ref 6.7–22.6)

## 2017-04-05 LAB — BRAIN NATRIURETIC PEPTIDE: B Natriuretic Peptide: 23.6 pg/mL (ref 0.0–100.0)

## 2017-04-05 LAB — HIV ANTIBODY (ROUTINE TESTING W REFLEX): HIV Screen 4th Generation wRfx: NONREACTIVE

## 2017-04-05 LAB — SEDIMENTATION RATE: SED RATE: 5 mm/h (ref 0–22)

## 2017-04-05 LAB — TSH: TSH: 0.844 u[IU]/mL (ref 0.350–4.500)

## 2017-04-05 MED ORDER — FUROSEMIDE 10 MG/ML IJ SOLN
40.0000 mg | Freq: Every day | INTRAMUSCULAR | Status: DC
  Administered 2017-04-05: 40 mg via INTRAVENOUS
  Filled 2017-04-05 (×2): qty 4

## 2017-04-05 MED ORDER — ACETAMINOPHEN 325 MG PO TABS: 650.0000 mg | ORAL_TABLET | Freq: Four times a day (QID) | ORAL | Status: DC | PRN

## 2017-04-05 MED ORDER — ONDANSETRON HCL 4 MG PO TABS: 4.0000 mg | ORAL_TABLET | Freq: Four times a day (QID) | ORAL | Status: DC | PRN

## 2017-04-05 MED ORDER — ONDANSETRON HCL 4 MG/2ML IJ SOLN: 4.0000 mg | Freq: Four times a day (QID) | INTRAMUSCULAR | Status: DC | PRN

## 2017-04-05 MED ORDER — DIPHENHYDRAMINE HCL 50 MG/ML IJ SOLN: 25.0000 mg | Freq: Four times a day (QID) | INTRAMUSCULAR | Status: DC | PRN

## 2017-04-05 MED ORDER — DIPHENHYDRAMINE HCL 50 MG/ML IJ SOLN
12.5000 mg | Freq: Four times a day (QID) | INTRAMUSCULAR | Status: DC | PRN
  Administered 2017-04-05 (×2): 12.5 mg via INTRAVENOUS
  Filled 2017-04-05 (×2): qty 1

## 2017-04-05 MED ORDER — ASPIRIN EC 81 MG PO TBEC
81.0000 mg | DELAYED_RELEASE_TABLET | Freq: Every day | ORAL | Status: DC
  Administered 2017-04-05 – 2017-04-06 (×2): 81 mg via ORAL
  Filled 2017-04-05 (×2): qty 1

## 2017-04-05 MED ORDER — DIPHENHYDRAMINE HCL 50 MG/ML IJ SOLN
25.0000 mg | Freq: Once | INTRAMUSCULAR | Status: AC
  Administered 2017-04-05: 25 mg via INTRAVENOUS
  Filled 2017-04-05: qty 1

## 2017-04-05 MED ORDER — ALBUTEROL SULFATE (2.5 MG/3ML) 0.083% IN NEBU: 2.5000 mg | INHALATION_SOLUTION | RESPIRATORY_TRACT | Status: DC | PRN

## 2017-04-05 MED ORDER — REFRESH P.M. OP OINT: 1.0000 [drp] | TOPICAL_OINTMENT | OPHTHALMIC | Status: DC | PRN

## 2017-04-05 MED ORDER — ENOXAPARIN SODIUM 40 MG/0.4ML ~~LOC~~ SOLN
40.0000 mg | SUBCUTANEOUS | Status: DC
  Administered 2017-04-05: 40 mg via SUBCUTANEOUS
  Filled 2017-04-05 (×2): qty 0.4

## 2017-04-05 MED ORDER — DEXTROSE 5 % IV SOLN
1.0000 g | INTRAVENOUS | Status: DC
  Administered 2017-04-05: 1 g via INTRAVENOUS
  Filled 2017-04-05 (×2): qty 10

## 2017-04-05 MED ORDER — VITAMIN B-12 100 MCG PO TABS
100.0000 ug | ORAL_TABLET | Freq: Every day | ORAL | Status: DC
  Administered 2017-04-05 – 2017-04-06 (×2): 100 ug via ORAL
  Filled 2017-04-05 (×2): qty 1

## 2017-04-05 MED ORDER — SODIUM CHLORIDE 0.9 % IV BOLUS (SEPSIS)
500.0000 mL | Freq: Once | INTRAVENOUS | Status: AC
  Administered 2017-04-05: 500 mL via INTRAVENOUS

## 2017-04-05 MED ORDER — ALUM & MAG HYDROXIDE-SIMETH 200-200-20 MG/5ML PO SUSP
30.0000 mL | ORAL | Status: DC | PRN
  Administered 2017-04-05: 30 mL via ORAL
  Filled 2017-04-05: qty 30

## 2017-04-05 MED ORDER — SODIUM CHLORIDE 0.9 % IV SOLN
INTRAVENOUS | Status: DC
  Administered 2017-04-05: 05:00:00 via INTRAVENOUS

## 2017-04-05 MED ORDER — ACETAMINOPHEN 650 MG RE SUPP: 650.0000 mg | Freq: Four times a day (QID) | RECTAL | Status: DC | PRN

## 2017-04-05 NOTE — ED Provider Notes (Addendum)
  Physical Exam  BP (!) 70/59 (BP Location: Left Arm)   Pulse 73   Temp 97.7 F (36.5 C) (Oral)   Resp 13   SpO2 100%   Physical Exam  ED Course  Procedures  MDM Patient was not able to ambulate home. Orthostatic. Urine showed possible infection. Does not appear to be septic. Will admit to internal medicine.       Benjiman Core, MD 04/05/17 0201  Discussed with Dr. Katrinka Blazing. Recommends more IV fluids and attempt to discharge home again. Does not think she needs admission at this time.    Benjiman Core, MD 04/05/17 [phone]

## 2017-04-05 NOTE — Progress Notes (Signed)
7:00 pm NCM spoke to pt at bedside. Unit RN attempting IV. Unsuccessful attempt. Pt was edematous on hand, legs and arms. States she takes Lasix at home but has not received while in hospital. Micah Flesher to bathroom but urinated small amounts. Contacted attending via phone with updated. Pt not scheduled for dc today, pending echo. Attending to update orders with IV abx and IV Lasix. Unit RN to contact IV team to restart peripheral IV. Isidoro Donning RN CCM Case Mgmt phone [phone]

## 2017-04-05 NOTE — H&P (Addendum)
History and Physical    Sheila Logan ZOX:[id number] DOB: [birth year] DOA: 04/04/2017  Referring MD/NP/PA: Dr. Alvino Chapel  PCP: Salena Saner., MD  Patient coming from: Home  Chief Complaint:  Passing out    HPI: Sheila Logan is a 76 y.o. female with medical history significant of HTN, lower extremity edema; who presents with complaints of passing out. History is obtained from the patient and family who are present at bedside. Patient gives a 1 -1/2 month history of not feeling well.   Approximately 6 weeks ago patient reports following while at home hitting her head.  At that time she was unsure if she may have passed out. Thereafter 3 weeks ago, she was diagnosed with a urinary tract infection for which she was treated with Bactrim. Over the last 24-48 hour patient has had at least 3 syncopal episodes. Associated symptoms include generalized pruritic rash( on hands, head, and back in the last day), facial flushing, headache, weight loss( the patient states this is warrented as she is on Weight Watchers), nausea, vomiting, decreased overall urinary output, and urinary frequency. Patient notes that she followed up last with her PCP on Friday but was unable to submit a urine sample at that time. 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 Patient admits to intermittent use of Lasix for lower extremity edema.  ED Course: On admission to the emergency department patient was noted to have normal lab work. Urinalysis was seen to be positive for signs of infection. Patient was started on oral cephalexin and being prepared for discharge. However, she was noted to be significant orthostatic despite 1 L of IV fluids TRH called to admit for observation and further treatment.   Review of Systems: As per HPI otherwise 10 point review of systems negative.   Past Medical  History:  Diagnosis Date  . Hypertension   . LTBI (latent tuberculosis infection)   . Uveitis     Past Surgical History:  Procedure Laterality Date  . WRIST SURGERY Right      reports that she quit smoking about 45 years ago. She has never used smokeless tobacco. She reports that she does not drink alcohol or use drugs.  No Known Allergies  Family History  Problem Relation Age of Onset  . Diabetes Mother   . Prostate cancer Father   . Heart attack Paternal Grandmother     Prior to Admission medications   Medication Sig Start Date End Date Taking? Authorizing Provider  aspirin 81 MG tablet Take 81 mg by mouth daily.   Yes Historical Provider, MD  B Complex-C (B-COMPLEX WITH VITAMIN C) tablet Take 1 tablet by mouth daily.   Yes Historical Provider, MD  cholecalciferol (VITAMIN D) 1000 UNITS tablet Take 1,000 Units by mouth daily.   Yes Historical Provider, MD  Flaxseed, Linseed, (FLAXSEED OIL) 1000 MG CAPS Take 1 capsule by mouth daily.   Yes Historical Provider, MD  furosemide (LASIX) 40 MG tablet Take 40 mg by mouth daily.   Yes Historical Provider, MD  magnesium oxide (MAG-OX) 400 MG tablet Take 400 mg by mouth daily.   Yes Historical Provider, MD  omega-3 acid ethyl esters (LOVAZA) 1 G capsule Take 1 g by mouth daily.   Yes Historical Provider, MD  potassium chloride SA (K-DUR,KLOR-CON) 20 MEQ tablet Take 20 mEq by mouth daily. 03/18/17  Yes Historical Provider, MD  vitamin B-12 (CYANOCOBALAMIN) 100 MCG tablet Take 100 mcg by mouth daily.   Yes Historical Provider, MD  vitamin C (ASCORBIC ACID) 250 MG tablet Take 250 mg by mouth daily.   Yes Historical Provider, MD  Artificial Tear Ointment (ARTIFICIAL TEARS) ointment Place 1 drop into both eyes as needed (dry eyes).    Historical Provider, MD  cephALEXin (KEFLEX) 500 MG capsule Take 1 capsule (500 mg total) by mouth 3 (three) times daily. 04/04/17 04/14/17  Leo Grosser, MD  omeprazole (PRILOSEC) 20 MG capsule Take 1 capsule (20  mg total) by mouth daily. 30 minutes before breakfast 04/04/17   Leo Grosser, MD    Physical Exam:   Constitutional: Elderly female who appears to be fatigued, but in no acute distress. Vitals:   04/05/17 0030 04/05/17 0057 04/05/17 0200 04/05/17 0230  BP: 102/69 (!) 70/59 110/71 107/71  Pulse: 86 73 68 69  Resp: 20 13 (!) 22 (!) 21  Temp:      TempSrc:      SpO2: 99% 100% 97% 95%   Eyes: PERRL, lids and conjunctivae normal ENMT: Mucous membranes are moist. Posterior pharynx clear of any exudate or lesions.  Neck: normal, supple, no masses, no thyromegaly Respiratory: clear to auscultation bilaterally, no wheezing, no crackles. Normal respiratory effort. No accessory muscle use.  Cardiovascular: Regular rate and rhythm, no murmurs / rubs / gallops. No extremity edema. 2+ pedal pulses. No carotid bruits.  Abdomen: no tenderness, no masses palpated. No hepatosplenomegaly. Bowel sounds positive.  Musculoskeletal: no clubbing / cyanosis. No joint deformity upper and lower extremities. Good ROM, no contractures. Normal muscle tone.  Skin: Nonspecific rash noted of the right upper extremity, neck, and back  Neurologic: CN 2-12 grossly intact. Sensation intact, DTR normal. Strength 5/5 in all 4.  Psychiatric: Normal judgment and insight. Alert and oriented x 3. Normal mood.     Labs on Admission: I have personally reviewed following labs and imaging studies  CBC:  Recent Labs Lab 04/04/17 2051  WBC 6.2  NEUTROABS 4.3  HGB 14.5  HCT 43.2  MCV 85.4  PLT 470   Basic Metabolic Panel:  Recent Labs Lab 04/04/17 2051  NA 136  K 3.6  CL 102  CO2 24  GLUCOSE 148*  BUN 20  CREATININE 0.96  CALCIUM 8.7*   GFR: CrCl cannot be calculated (Unknown ideal weight.). Liver Function Tests:  Recent Labs Lab 04/04/17 2051  AST 30  ALT 21  ALKPHOS 56  BILITOT 0.6  PROT 6.5  ALBUMIN 3.5   No results for input(s): LIPASE, AMYLASE in the last 168 hours. No results for  input(s): AMMONIA in the last 168 hours. Coagulation Profile: No results for input(s): INR, PROTIME in the last 168 hours. Cardiac Enzymes: No results for input(s): CKTOTAL, CKMB, CKMBINDEX, TROPONINI in the last 168 hours. BNP (last 3 results) No results for input(s): PROBNP in the last 8760 hours. HbA1C: No results for input(s): HGBA1C in the last 72 hours. CBG:  Recent Labs Lab 04/04/17 2135  GLUCAP 129*   Lipid Profile: No results for input(s): CHOL, HDL, LDLCALC, TRIG, CHOLHDL, LDLDIRECT in the last 72 hours. Thyroid Function Tests: No results for input(s): TSH, T4TOTAL, FREET4, T3FREE, THYROIDAB in the last 72 hours. Anemia Panel: No results for input(s): VITAMINB12, FOLATE, FERRITIN, TIBC, IRON, RETICCTPCT in the last 72 hours. Urine analysis:    Component Value Date/Time   COLORURINE YELLOW 04/04/2017 2204   APPEARANCEUR HAZY (A) 04/04/2017 2204   LABSPEC 1.016 04/04/2017  [place] 6.0 04/04/2017 [place] 04/04/2017 [place] 04/04/2017 [place] 04/04/2017 [place] 04/04/2017 2204   PROTEINUR NEGATIVE 04/04/2017 2204   NITRITE NEGATIVE 04/04/2017 2204   LEUKOCYTESUR LARGE (A) 04/04/2017 2204   Sepsis Labs: No results found for this or any previous visit (from the past 240 hour(s)).   Radiological Exams on Admission: No results found.  EKG: Independently reviewed. Sinus rhythm with LAFB  Assessment/Plan Recurrent syncopal episodes: Acute. Patient reportedly had 3 possible syncopal episodes in the last 48 hours. - Admit to a telemetry bed - Check TSH and portable CXR - Consider need of echocardiogram in a.m   Orthostatic hypotension 2/2 dehydration: Acute. Patient noted to be significantly orthostatic on admission even after 1 L of IV fluids. Patient was ordered Logan additional 1 L of NS while in the ED. - IV Fluids NS at 75 ml/hr  - Recheck orthostatics vital signs in a.m.  - Check 7 a.m  Cortisol level  UTI (urinary tract infection):  acute. Patient presents with complaints of urinary frequency. UA showed signs of possible infection. Patient was given cephalexin po in the ED. - Follow-up urine culture   - Check hemoglobin A1c and CRP   Lower extremity edema: Patient reports taking Lasix intermittently for lower extremity edema. - Held Lasix  Pruritic rash/hives: Unclear if the patient allergic to some unknown. - Continue to monitor  - Check ESR - Benadryl IV prn [place]  DVT prophylaxis: Lovenox   Code Status: Full Family Communication: Discussed plan of care with patient and family isn't at bedside  Disposition Plan: Likely discharge home medically stable in a.m.  Consults called: None  Admission status: Observation  Norval Morton MD [facility] Pager [phone]  If 7PM-7AM, please contact night-coverage [email or web address] Password [email or web address]  04/05/2017, 3:09 AM

## 2017-04-05 NOTE — Progress Notes (Signed)
Pt brought empty bottle of Bactrim for provider to review with her. She would also like to take potassium meds she has brought from home during her stay here.

## 2017-04-05 NOTE — Progress Notes (Addendum)
Pt admitted after midnight, for details, please refer to admission note done 4/14. Pt admitted for syncope eval. ECHO is pending. PT is pending. UA significant for large leukocytes, unclear why abx not started on admission but pt needs to be on IV rocephin until urine cx results are back.  Manson Passey [facility] [phone]

## 2017-04-05 NOTE — ED Notes (Addendum)
Attempted to ambulate, but pt was unable to walk. Pt was very dizzy while standing and not able to hold herself up for long. Pt's BP after the attempt was 70/59 (64). RN was notified.

## 2017-04-06 ENCOUNTER — Observation Stay (HOSPITAL_BASED_OUTPATIENT_CLINIC_OR_DEPARTMENT_OTHER): Payer: Medicare Other

## 2017-04-06 DIAGNOSIS — N390 Urinary tract infection, site not specified: Secondary | ICD-10-CM

## 2017-04-06 DIAGNOSIS — R55 Syncope and collapse: Secondary | ICD-10-CM | POA: Diagnosis not present

## 2017-04-06 LAB — URINE CULTURE

## 2017-04-06 LAB — ECHOCARDIOGRAM COMPLETE
HEIGHTINCHES: 56 in
Weight: 2620.83 oz

## 2017-04-06 LAB — HEMOGLOBIN A1C
Hgb A1c MFr Bld: 5.3 % (ref 4.8–5.6)
Mean Plasma Glucose: 105 mg/dL

## 2017-04-06 MED ORDER — CIPROFLOXACIN HCL 500 MG PO TABS: 500.0000 mg | ORAL_TABLET | Freq: Two times a day (BID) | ORAL | 0 refills | Status: DC

## 2017-04-06 NOTE — Discharge Summary (Signed)
Physician Discharge Summary  Sheila Logan WUJ:[id number] DOB: 06/26/1941 DOA: 04/04/2017  PCP: Alva Garnet., MD  Admit date: 04/04/2017 Discharge date: 04/06/2017  Recommendations for Outpatient Follow-up:  Continue cipro for 7 days for UTI ECHO showed normal ejection fraction. Please continue lasix as per prior to this admission. Follow up with PCP in 1 week to make sure symptoms are stable.  Discharge Diagnoses:  Principal Problem:   Syncope Active Problems:   UTI (urinary tract infection)   Orthostatic hypotension   Pruritic rash    Discharge Condition: stable   Diet recommendation: as tolerated   History of present illness:   Per HPI "76 y.o. female with medical history significant of HTN, lower extremity edema; who presents with complaints of passing out. History is obtained from the patient and family who are present at bedside. Patient gives a 1 -1/2 month history of not feeling well.   Approximately 6 weeks ago patient reports following while at home hitting her head.  At that time she was unsure if she may have passed out. Thereafter 3 weeks ago, she was diagnosed with a urinary tract infection for which she was treated with Bactrim. Over the last 24-48 hour patient has had at least 3 syncopal episodes. Associated symptoms include generalized pruritic rash( on hands, head, and back in the last day), facial flushing, headache, weight loss( the patient states this is warrented as she is on Weight Watchers), nausea, vomiting, decreased overall urinary output, and urinary frequency. Patient notes that she followed up last with her PCP on Friday but was unable to submit a urine sample at that time. 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 Patient admits to intermittent use of Lasix for lower extremity edema."   Hospital Course:  Principal  Problem:   Syncope - ECHO with normal EF, grade 2 DD - Continue lasix on d/c - Pt reported doing weight watchers and if her protein intake is excessive and not much carbohydrate intake this could contribute to her passing out - PT - HH PT, order placed   Active Problems:   UTI (urinary tract infection) - Multiple species, none predominant - Stop rocephin and use cipro on discharge for 7 days     Signed:  Manson Passey, MD  [facility] 04/06/2017, 1:39 PM  Pager #: [phone]  Time spent in minutes: less than 30 minutes  Procedures:  ECHO - norma EF, grade 2 DD  Consultations:  PT  Discharge Exam: Vitals:   04/05/17 2117 04/06/17 0542  BP: (!) 111/54 (!) 115/57  Pulse: 91 69  Resp: 19 18  Temp: 98.1 F (36.7 C) 98 F (36.7 C)   Vitals:   04/05/17 0501 04/05/17 1337 04/05/17 2117 04/06/17 0542  BP: 119/66 99/63 (!) 111/54 (!) 115/57  Pulse: 66 84 91 69  Resp:  Temp: 98.2 F (36.8 C) 98.1 F (36.7 C) 98.1 F (36.7 C) 98 F (36.7 C)  TempSrc: Oral Oral Oral Oral  SpO2: 99% 99% 97% 98%  Weight: 74.3 kg (163 lb 12.8 oz)     Height:  (1.422 m)       General: Pt is alert, follows commands appropriately, not in acute distress Cardiovascular: Regular rate and rhythm, S1/S2 +, no murmurs Respiratory: Clear to auscultation bilaterally, no wheezing, no crackles, no rhonchi Abdominal: Soft, non tender, non distended, bowel sounds +,  no guarding Extremities: trace non pitting edema LE, no cyanosis, pulses palpable bilaterally DP and PT Neuro: Grossly nonfocal  Discharge Instructions  Discharge Instructions    Call MD for:  persistant nausea and vomiting    Complete by:  As directed    Call MD for:  redness, tenderness, or signs of infection (pain, swelling, redness, odor or green/yellow discharge around incision site)    Complete by:  As directed    Call MD for:  severe uncontrolled pain    Complete by:  As directed    Diet - low sodium  heart healthy    Complete by:  As directed    Discharge instructions    Complete by:  As directed    Continue cipro for 7 days for UTI ECHO showed normal ejection fraction. Please continue lasix as per prior to this admission. Follow up with PCP in 1 week to make sure symptoms are stable.   Increase activity slowly    Complete by:  As directed      Allergies as of 04/06/2017   No Known Allergies     Medication List    TAKE these medications   artificial tears ointment Place 1 drop into both eyes as needed (dry eyes).   aspirin 81 MG tablet Take 81 mg by mouth daily.   B-complex with vitamin C tablet Take 1 tablet by mouth daily.   cholecalciferol 1000 units tablet Commonly known as:  VITAMIN D Take 1,000 Units by mouth daily.   ciprofloxacin 500 MG tablet Commonly known as:  CIPRO Take 1 tablet (500 mg total) by mouth 2 (two) times daily.   Flaxseed Oil 1000 MG Caps Take 1 capsule by mouth daily.   furosemide 40 MG tablet Commonly known as:  LASIX Take 40 mg by mouth daily.   magnesium oxide 400 MG tablet Commonly known as:  MAG-OX Take 400 mg by mouth daily.   omega-3 acid ethyl esters 1 g capsule Commonly known as:  LOVAZA Take 1 g by mouth daily.   omeprazole 20 MG capsule Commonly known as:  PRILOSEC Take 1 capsule (20 mg total) by mouth daily. 30 minutes before breakfast   potassium chloride SA 20 MEQ tablet Commonly known as:  K-DUR,KLOR-CON Take 20 mEq by mouth daily.   vitamin B-12 100 MCG tablet Commonly known as:  CYANOCOBALAMIN Take 100 mcg by mouth daily.   vitamin C 250 MG tablet Commonly known as:  ASCORBIC ACID Take 250 mg by mouth daily.      Follow-up Information    Schedule an appointment as soon as possible for a visit with Alva Garnet., MD.   Specialty:  Internal Medicine Why:  for re-evaluation Contact information: [address] [place] [address] [phone]        [facility] Follow up.   Why:  Home Health Physical Therapy Contact information: [address] [place] [address] [phone]            The results of significant diagnostics from this hospitalization (including imaging, microbiology, ancillary and laboratory) are listed below for reference.    Significant Diagnostic Studies: Mr Shoulder Right Wo Contrast  Result Date: 03/27/2017 CLINICAL DATA:  Right shoulder pain for several months EXAM: MRI OF THE RIGHT SHOULDER WITHOUT CONTRAST TECHNIQUE: Multiplanar, multisequence MR imaging of the shoulder was performed. No intravenous contrast was administered. COMPARISON:  None. FINDINGS: Rotator cuff: Severe tendinosis of the supraspinatus tendon with a full-thickness tear measuring  15 mm in anterior- posterior dimension. Mild tendinosis of the infraspinatus tendon. Teres minor tendon is intact. Subscapularis tendon is intact. Muscles: No atrophy or fatty replacement of nor abnormal signal within, the muscles of the rotator cuff. Biceps long head: Mild tendinosis of the intraarticular portion of the long head of the biceps tendon. Acromioclavicular Joint: Mild arthropathy of the acromioclavicular joint. Type II acromion. Small amount of subacromial/ subdeltoid bursal fluid. Glenohumeral Joint: No joint effusion. Normal Hoffa's fat. No plical thickening. Labrum: Grossly intact, but evaluation is limited by lack of intraarticular fluid. Bones:  No marrow signal abnormality.  No fracture or dislocation. Other: No fluid collection or hematoma. IMPRESSION: 1. 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 2. Mild tendinosis of the infraspinatus tendon. 3. Mild tendinosis of the intraarticular portion of the long head of the biceps tendon. Electronically Signed   By: Elige Ko   On: 03/27/2017 09:00   Dg Chest Port 1 View  Result Date: 04/05/2017 CLINICAL DATA:  Acute onset  of generalized fatigue and dizziness. Initial encounter. EXAM: PORTABLE CHEST 1 VIEW COMPARISON:  Chest radiograph performed 02/05/2008 FINDINGS: The lungs are well-aerated. There is mild elevation of the right hemidiaphragm. There is no evidence of focal opacification, pleural effusion or pneumothorax. The cardiomediastinal silhouette is borderline normal in size. No acute osseous abnormalities are seen. IMPRESSION: Mild elevation of the right hemidiaphragm. Lungs remain grossly clear. Electronically Signed   By: Roanna Raider M.D.   On: 04/05/2017 04:48    Microbiology: Recent Results (from the past 240 hour(s))  Urine culture     Status: Abnormal   Collection Time: 04/04/17 10:04 PM  Result Value Ref Range Status   Specimen Description URINE, RANDOM  Final   Special Requests NONE  Final   Culture MULTIPLE SPECIES PRESENT, SUGGEST RECOLLECTION (A)  Final   Report Status 04/06/2017 FINAL  Final     Labs: Basic Metabolic Panel:  Recent Labs Lab 04/04/17 2051  NA 136  K 3.6  CL 102  CO2 24  GLUCOSE 148*  BUN 20  CREATININE 0.96  CALCIUM 8.7*   Liver Function Tests:  Recent Labs Lab 04/04/17 2051  AST 30  ALT 21  ALKPHOS 56  BILITOT 0.6  PROT 6.5  ALBUMIN 3.5   No results for input(s): LIPASE, AMYLASE in the last 168 hours. No results for input(s): AMMONIA in the last 168 hours. CBC:  Recent Labs Lab 04/04/17 2051  WBC 6.2  NEUTROABS 4.3  HGB 14.5  HCT 43.2  MCV 85.4  PLT 307   Cardiac Enzymes: No results for input(s): CKTOTAL, CKMB, CKMBINDEX, TROPONINI in the last 168 hours. BNP: BNP (last 3 results)  Recent Labs  04/05/17 2032  BNP 23.6    ProBNP (last 3 results) No results for input(s): PROBNP in the last 8760 hours.  CBG:  Recent Labs Lab 04/04/17 2135  GLUCAP 129*

## 2017-04-06 NOTE — Progress Notes (Signed)
  Echocardiogram 2D Echocardiogram has been performed.  Sheila Logan 04/06/2017, 8:41 AM

## 2017-04-06 NOTE — Progress Notes (Signed)
  Echocardiogram 2D Echocardiogram has been performed.  Sheila Logan 04/06/2017, 8:50 AM

## 2017-04-06 NOTE — Discharge Instructions (Signed)
Syncope °Syncope is when you temporarily lose consciousness. Syncope may also be called fainting or passing out. It is caused by a sudden decrease in blood flow to the brain. Even though most causes of syncope are not dangerous, syncope can be a sign of a serious medical problem. Signs that you may be about to faint include: °· Feeling dizzy or light-headed. °· Feeling nauseous. °· Seeing all white or all black in your field of vision. °· Having cold, clammy skin. °If you fainted, get medical help right away.Call your local emergency services (911 in the [place] Do not drive yourself to the hospital. °Follow these instructions at home: °Pay attention to any changes in your symptoms. Take these actions to help with your condition: °· Have someone stay with you until you feel stable. °· Do not drive, use machinery, or play sports until your health care provider says it is okay. °· Keep all follow-up visits as told by your health care provider. This is important. °· If you start to feel like you might faint, lie down right away and raise (elevate) your feet above the level of your heart. Breathe deeply and steadily. Wait until all of the symptoms have passed. °· Drink enough fluid to keep your urine clear or pale yellow. °· If you are taking blood pressure or heart medicine, get up slowly and take several minutes to sit and then stand. This can reduce dizziness. °· Take over-the-counter and prescription medicines only as told by your health care provider. °Get help right away if: °· You have a severe headache. °· You have unusual pain in your chest, abdomen, or back. °· You are bleeding from your mouth or rectum, or you have black or tarry stool. °· You have a very fast or irregular heartbeat (palpitations). °· You have pain with breathing. °· You faint once or repeatedly. °· You have a seizure. °· You are confused. °· You have trouble walking. °· You have severe weakness. °· You have vision problems. °These symptoms  may represent a serious problem that is an emergency. Do not wait to see if your symptoms will go away. Get medical help right away. Call your local emergency services (911 in the [place] Do not drive yourself to the hospital. °This information is not intended to replace advice given to you by your health care provider. Make sure you discuss any questions you have with your health care provider. °Document Released: 12/09/2005 Document Revised: 05/16/2016 Document Reviewed: 08/23/2015 °Elsevier Interactive Patient Education © 2017 [employer] ° °

## 2017-04-06 NOTE — Care Management Obs Status (Signed)
MEDICARE OBSERVATION STATUS NOTIFICATION   Patient Details  Name: Sheila Logan MRN: [id number] Date of Birth: 10-30-1941   Medicare Observation Status Notification Given:  Yes    Elliot Cousin, RN 04/06/2017, 1:01 PM

## 2017-04-06 NOTE — Evaluation (Signed)
Physical Therapy Evaluation Patient Details Name: Sheila Logan MRN: [id number] DOB: 1941/09/16 Today's Date: 04/06/2017   History of Present Illness  76 yo female admitted with syncope. Hx of HTN, LE edema. Per chart, recent MRI (4/5) + R shoulder rotator cuff tear  Clinical Impression  On eval, pt was Min guard assist for mobility. She walked ~100 feet and climbed 9 stairs with use of 1 rail. Pt presents with general weakness, decreased activity tolerance, and impaired gait and balance. Pt also reports feeling weaker than baseline. She denied dizziness/lightheadedness during session. See vitals section for orthostatics. Recommend HHPT follow up.     Follow Up Recommendations Home health PT    Equipment Recommendations  None recommended by PT    Recommendations for Other Services       Precautions / Restrictions Precautions Precautions: Fall Restrictions Weight Bearing Restrictions: No      Mobility  Bed Mobility Overal bed mobility: Independent             General bed mobility comments: pt denied dizziness/lightheadedness  Transfers Overall transfer level: Modified independent               General transfer comment: pt denied dizziness/lightheadedness  Ambulation/Gait Ambulation/Gait assistance: Supervision Ambulation Distance (Feet): 100 Feet   Gait Pattern/deviations: Step-through pattern;Decreased stride length     General Gait Details: decreased gait speed. Mildly unsteady at times. Pt c/o LEs feeling weak.  Stairs Stairs: Yes Stairs assistance: Min guard Stair Management: One rail Left;Step to pattern;Alternating pattern Number of Stairs: 9 General stair comments: close guard for safety. pt used reciprocal pattern while ascending and step to pattern while descending.   Wheelchair Mobility    Modified Rankin (Stroke Patients Only)       Balance                                             Pertinent Vitals/Pain  Pain Assessment: No/denies pain    Home Living Family/patient expects to be discharged to:: Private residence Living Arrangements: Spouse/significant other   Type of Home: House   Entrance Stairs-Rails: Right Entrance Stairs-Number of Steps: 3 Home Layout: One level;Laundry or work area in [employer] Equipment: Gilmer Mor - single point      Prior Function Level of Independence: Independent               Higher education careers adviser        Extremity/Trunk Assessment        Lower Extremity Assessment Lower Extremity Assessment: Generalized weakness    Cervical / Trunk Assessment Cervical / Trunk Assessment: Normal  Communication   Communication: No difficulties  Cognition Arousal/Alertness: Awake/alert Behavior During Therapy: WFL for tasks assessed/performed Overall Cognitive Status: Within Functional Limits for tasks assessed                                 General Comments: talkative      General Comments      Exercises     Assessment/Plan    PT Assessment Patient needs continued PT services  PT Problem List Decreased strength;Decreased mobility;Decreased activity tolerance       PT Treatment Interventions Gait training;Therapeutic activities;Therapeutic exercise;Functional mobility training    PT Goals (Current goals can be found in the Care Plan section)  Acute Rehab PT Goals Patient  Stated Goal: regain independence. to feel better/stronger PT Goal Formulation: With patient Time For Goal Achievement: 04/20/17 Potential to Achieve Goals: Good    Frequency Min 3X/week   Barriers to discharge        Co-evaluation               End of Session Equipment Utilized During Treatment: Gait belt Activity Tolerance: Patient tolerated treatment well Patient left: in chair;with call bell/phone within reach;with chair alarm set   PT Visit Diagnosis: Muscle weakness (generalized) (M62.81);Difficulty in walking, not elsewhere classified  (R26.2)    Time: 1610-9604 PT Time Calculation (min) (ACUTE ONLY): 20 min   Charges:   PT Evaluation $PT Eval Low Complexity: 1 Procedure     PT G Codes:   PT G-Codes **NOT FOR INPATIENT CLASS** Functional Assessment Tool Used: AM-PAC 6 Clicks Basic Mobility;Clinical judgement Functional Limitation: Mobility: Walking and moving around Mobility: Walking and Moving Around Current Status (V4098): At least 1 percent but less than 20 percent impaired, limited or restricted Mobility: Walking and Moving Around Goal Status [phone]): At least 1 percent but less than 20 percent impaired, limited or restricted      Rebeca Alert, MPT Pager: [phone]

## 2017-04-06 NOTE — Care Management Note (Addendum)
Case Management Note  Patient Details  Name: Sheila Logan MRN: [id number] Date of Birth: 08/09/1941  Subjective/Objective:   Syncope, UTI                 Action/Plan: Discharge Planning: NCM spoke to pt and lives at home with husband. Offered choice for HH/list provided. Pt states husband had HH in the past. Contacted [facility] Liaison for [facility] PT.   PCP Andi Devon MD  Expected Discharge Date:  04/06/2017              Expected Discharge Plan:  Home w Home Health Services  In-House Referral:  NA  Discharge planning Services  CM Consult  Post Acute Care Choice:  Home Health Choice offered to:  Patient  DME Arranged:  N/A DME Agency:  NA  HH Arranged:  PT HH Agency:  [facility]  Status of Service:  Completed, signed off  If discussed at Long Length of Stay Meetings, dates discussed:    Additional Comments:  Elliot Cousin, RN 04/06/2017, 1:08 PM

## 2017-04-10 ENCOUNTER — Other Ambulatory Visit: Payer: Self-pay | Admitting: Internal Medicine

## 2017-04-10 DIAGNOSIS — R55 Syncope and collapse: Secondary | ICD-10-CM

## 2017-04-11 ENCOUNTER — Ambulatory Visit
Admission: RE | Admit: 2017-04-11 | Discharge: 2017-04-11 | Disposition: A | Discharge status: Home / Self Care | Payer: Medicare Other | Source: Ambulatory Visit | Attending: Internal Medicine | Admitting: Internal Medicine

## 2017-04-11 DIAGNOSIS — R55 Syncope and collapse: Secondary | ICD-10-CM

## 2017-04-11 MED ORDER — GADOBENATE DIMEGLUMINE 529 MG/ML IV SOLN
15.0000 mL | Freq: Once | INTRAVENOUS | Status: AC | PRN
  Administered 2017-04-11: 15 mL via INTRAVENOUS

## 2017-04-17 ENCOUNTER — Ambulatory Visit
Admission: RE | Admit: 2017-04-17 | Discharge: 2017-04-17 | Disposition: A | Discharge status: Home / Self Care | Payer: Medicare Other | Source: Ambulatory Visit | Attending: Internal Medicine | Admitting: Internal Medicine

## 2017-04-17 DIAGNOSIS — R55 Syncope and collapse: Secondary | ICD-10-CM

## 2017-05-02 ENCOUNTER — Encounter: Payer: Self-pay | Admitting: Neurology

## 2017-05-21 ENCOUNTER — Ambulatory Visit: Payer: Self-pay | Admitting: Diagnostic Neuroimaging

## 2017-05-21 ENCOUNTER — Ambulatory Visit (INDEPENDENT_AMBULATORY_CARE_PROVIDER_SITE_OTHER): Payer: Medicare Other | Admitting: Diagnostic Neuroimaging

## 2017-05-21 ENCOUNTER — Encounter: Payer: Self-pay | Admitting: Diagnostic Neuroimaging

## 2017-05-21 ENCOUNTER — Encounter (INDEPENDENT_AMBULATORY_CARE_PROVIDER_SITE_OTHER): Payer: Self-pay

## 2017-05-21 VITALS — BP 116/70 | HR 66 | Ht <= 58 in | Wt 168.0 lb

## 2017-05-21 DIAGNOSIS — G959 Disease of spinal cord, unspecified: Secondary | ICD-10-CM | POA: Diagnosis not present

## 2017-05-21 DIAGNOSIS — R413 Other amnesia: Secondary | ICD-10-CM

## 2017-05-21 DIAGNOSIS — R269 Unspecified abnormalities of gait and mobility: Secondary | ICD-10-CM | POA: Diagnosis not present

## 2017-05-21 DIAGNOSIS — M48062 Spinal stenosis, lumbar region with neurogenic claudication: Secondary | ICD-10-CM | POA: Diagnosis not present

## 2017-05-21 DIAGNOSIS — R42 Dizziness and giddiness: Secondary | ICD-10-CM | POA: Diagnosis not present
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
## 2017-05-21 NOTE — Patient Instructions (Signed)
  Thank you for coming to see [place] at [facility]. I hope we have been able to provide you high quality care today.  You may receive a patient satisfaction survey over the next few weeks. We would appreciate your feedback and comments so that we may continue to improve ourselves and the health of our patients.  DIZZINESS (new problem, no additional workup) - check home BP measurements - consider vestibular physical therapy  GAIT DIFFICULTY (new problem, additional workup) - check MRI cervical spine and MRI lumbar spine - consider physical evaluation  MEMORY LOSS (new problem, additional workup) - check B12 level - patient interested in dementia research study   ~~~~~~~~~~~~~~~~~~~~~~~~~~~~~~~~~~~~~~~~~~~~~~~~~~~~~~~~~~~~~~~~~  DR. Evan Osburn'S GUIDE TO HAPPY AND HEALTHY LIVING These are some of my general health and wellness recommendations. Some of them may apply to you better than others. Please use common sense as you try these suggestions and feel free to ask me any questions.   ACTIVITY/FITNESS Mental, social, emotional and physical stimulation are very important for brain and body health. Try learning a new activity (arts, music, language, sports, games).  Keep moving your body to the best of your abilities. You can do this at home, inside or outside, the park, community center, gym or anywhere you like. Consider a physical therapist or personal trainer to get started. Consider the app Sworkit. Fitness trackers such as smart-watches, smart-phones or Fitbits can help as well.   NUTRITION Eat more plants: colorful vegetables, nuts, seeds and berries.  Eat less sugar, salt, preservatives and processed foods.  Avoid toxins such as cigarettes and alcohol.  Drink water when you are thirsty. Warm water with a slice of lemon is an excellent morning drink to start the day.  Consider these websites for more information The Nutrition Source  (https://www.henry-hernandez.biz/) Precision Nutrition (WindowBlog.ch)   RELAXATION Consider practicing mindfulness meditation or other relaxation techniques such as deep breathing, prayer, yoga, tai chi, massage. See website mindful.org or the apps Headspace or Calm to help get started.   SLEEP Try to get at least 7-8+ hours sleep per day. Regular exercise and reduced caffeine will help you sleep better. Practice good sleep hygeine techniques. See website sleep.org for more information.   PLANNING Prepare estate planning, living will, healthcare POA documents. Sometimes this is best planned with the help of an attorney. Theconversationproject.org and agingwithdignity.org are excellent resources.

## 2017-05-22 ENCOUNTER — Telehealth: Payer: Self-pay | Admitting: *Deleted

## 2017-05-22 LAB — VITAMIN B12: Vitamin B-12: >2000 pg/mL — ABNORMAL HIGH (ref 232–1245)

## 2017-05-22 NOTE — Telephone Encounter (Signed)
Spoke with patient and informed her that her lab result was normal. She verbalized understanding, stated she was "very impressed with Dr Marjory LiesPenumalli yesterday. He was very thorough." She stated she has MRI scheduled. Advised her she'll get a call with those results when available. She verbalized appreciation.

## 2017-06-04 ENCOUNTER — Ambulatory Visit
Admission: RE | Admit: 2017-06-04 | Discharge: 2017-06-04 | Disposition: A | Discharge status: Home / Self Care | Payer: Medicare Other | Source: Ambulatory Visit | Attending: Diagnostic Neuroimaging | Admitting: Diagnostic Neuroimaging

## 2017-06-04 DIAGNOSIS — R269 Unspecified abnormalities of gait and mobility: Secondary | ICD-10-CM

## 2017-06-04 DIAGNOSIS — M48062 Spinal stenosis, lumbar region with neurogenic claudication: Secondary | ICD-10-CM

## 2017-06-04 DIAGNOSIS — G959 Disease of spinal cord, unspecified: Secondary | ICD-10-CM

## 2017-06-04 DIAGNOSIS — R42 Dizziness and giddiness: Secondary | ICD-10-CM

## 2017-06-09 ENCOUNTER — Telehealth: Payer: Self-pay | Admitting: *Deleted

## 2017-06-09 NOTE — Telephone Encounter (Signed)
Called primary number, niece [place]ta, [place] and informed her of patient's MRI cervical, lumbar spine. She stated that she is primary because patient sometimes forgets details of reports. She requested this RN also call patient. Detta wrote down results of MRI cervical which shows slightly worsening spinal stenosis when compared with the MRI from  07/21/14. Advised the patient may consider referral to neurosurgery for consult or conservative management. Informed her that the MRI lumbar spine showed severe spinal stenosis at L4-5. Advised this is slightly worse than the MRI from 06/2014. Again advised Dr Marjory LiesPenumalli recommended she may consider neurosurgical consult or conservative management.  Advised Detta that Dr Marjory LiesPenumalli advised patient consider PT.  Detta verbalized understanding, then she asked if Dr Marjory LiesPenumalli would consider a medication for patient's dementia. She stated that was discussed but Dr Marjory LiesPenumalli stated he was not certain it would slow down her dementia. This RN advised she could discuss with Dr at next FU in Sept. Detta agreed, verbalized understanding, appreciation.  Spoke with patient and gave her results per previous note with niece Detta. She stated she has done multiple PT sessions in the past. She inquired how to obtain information for her PCP. This RN advised Dr Dolly RiasK Shelton can see the same information on her medical record. Advised her that she can discuss further in her FU with Dr Marjory LiesPenumalli 09/12/17. She verbalized understanding, appreciation.

## 2017-06-20 ENCOUNTER — Emergency Department (HOSPITAL_COMMUNITY)
Admission: EM | Admit: 2017-06-20 | Discharge: 2017-06-20 | Disposition: A | Discharge status: Home / Self Care | Payer: Medicare Other | Attending: Emergency Medicine | Admitting: Emergency Medicine

## 2017-06-20 ENCOUNTER — Emergency Department (HOSPITAL_COMMUNITY): Payer: Medicare Other

## 2017-06-20 ENCOUNTER — Encounter (HOSPITAL_COMMUNITY): Payer: Self-pay | Admitting: Emergency Medicine

## 2017-06-20 DIAGNOSIS — Z79899 Other long term (current) drug therapy: Secondary | ICD-10-CM | POA: Diagnosis not present

## 2017-06-20 DIAGNOSIS — I10 Essential (primary) hypertension: Secondary | ICD-10-CM | POA: Diagnosis not present

## 2017-06-20 DIAGNOSIS — Z7982 Long term (current) use of aspirin: Secondary | ICD-10-CM | POA: Insufficient documentation

## 2017-06-20 DIAGNOSIS — Y92002 Bathroom of unspecified non-institutional (private) residence single-family (private) house as the place of occurrence of the external cause: Secondary | ICD-10-CM | POA: Insufficient documentation

## 2017-06-20 DIAGNOSIS — Z87891 Personal history of nicotine dependence: Secondary | ICD-10-CM | POA: Insufficient documentation

## 2017-06-20 DIAGNOSIS — W16212A Fall in (into) filled bathtub causing other injury, initial encounter: Secondary | ICD-10-CM | POA: Diagnosis not present

## 2017-06-20 DIAGNOSIS — Y93E1 Activity, personal bathing and showering: Secondary | ICD-10-CM | POA: Diagnosis not present

## 2017-06-20 DIAGNOSIS — Y999 Unspecified external cause status: Secondary | ICD-10-CM | POA: Diagnosis not present

## 2017-06-20 DIAGNOSIS — S43004A Unspecified dislocation of right shoulder joint, initial encounter: Secondary | ICD-10-CM | POA: Diagnosis not present

## 2017-06-20 DIAGNOSIS — S4991XA Unspecified injury of right shoulder and upper arm, initial encounter: Secondary | ICD-10-CM | POA: Diagnosis present

## 2017-06-20 MED ORDER — FENTANYL CITRATE (PF) 100 MCG/2ML IJ SOLN
50.0000 ug | Freq: Once | INTRAMUSCULAR | Status: AC
  Administered 2017-06-20: 50 ug via NASAL

## 2017-06-20 MED ORDER — IBUPROFEN 600 MG PO TABS: 600.0000 mg | ORAL_TABLET | Freq: Four times a day (QID) | ORAL | 0 refills | Status: DC | PRN

## 2017-06-20 MED ORDER — ACETAMINOPHEN 325 MG PO TABS: 650.0000 mg | ORAL_TABLET | Freq: Four times a day (QID) | ORAL | 0 refills | Status: DC | PRN

## 2017-06-20 MED ORDER — ETOMIDATE 2 MG/ML IV SOLN
16.0000 mg | Freq: Once | INTRAVENOUS | Status: DC
  Filled 2017-06-20: qty 10

## 2017-06-20 MED ORDER — HYDROCODONE-ACETAMINOPHEN 5-325 MG PO TABS
1.0000 | ORAL_TABLET | Freq: Once | ORAL | Status: AC
  Administered 2017-06-20: 1 via ORAL
  Filled 2017-06-20: qty 1

## 2017-06-20 MED ORDER — FENTANYL CITRATE (PF) 100 MCG/2ML IJ SOLN
50.0000 ug | Freq: Once | INTRAMUSCULAR | Status: DC
  Filled 2017-06-20: qty 2

## 2017-06-20 NOTE — ED Triage Notes (Signed)
Pt complaint of right shoulder pain post falling in tub; no LOC or blood thinner use.

## 2017-06-20 NOTE — ED Provider Notes (Signed)
[facility]-EMERGENCY DEPT Provider Note   CSN: [id number] Arrival date & time: 06/20/17  1250  By signing my name below, I, Sheila Logan, attest that this documentation has been prepared under the direction and in the presence of [facility] Lien, PA-C. Electronically Signed: Cynda AcresHailei Logan, Scribe. 06/20/17. 2:31 PM.  History   Chief Complaint Chief Complaint  Patient presents with  . Shoulder Pain   HPI Comments: Sheila Logan is a 76 y.o. female with a history of hypertension and right rotator cuff disorder, who presents to the Emergency Department complaining of sudden-onset, constant severe right shoulder pain that began earlier today. Patient states she was getting in the bath tub and she fell on the right side on the tub, injuring her right shoulder. Patient has 10/10 aching right shoulder pain. Patient denies hitting her head or loss of consciousness. Patient reports a history of rotator problems. Patient reports associated numbness of right fingertips. No medications or alleviating factors tried prior to arrival. Patient states raising/moving her arm makes her pain worse, nothing improves her pain. Oriented x3. Patient denies any fever, chills, neck pain, back pain, weakness, or any additional symptoms.   The history is provided by the patient. No language interpreter was used.    Past Medical History:  Diagnosis Date  . Hypertension   . LTBI (latent tuberculosis infection)   . Rotator cuff disorder    right  . Uveitis     Patient Active Problem List   Diagnosis Date Noted  . UTI (urinary tract infection) 04/05/2017  . Syncope 04/05/2017  . Orthostatic hypotension 04/05/2017  . Pruritic rash 04/05/2017  . LTBI (latent tuberculosis infection) 01/31/2015  . HTN (hypertension) 01/31/2015    Past Surgical History:  Procedure Laterality Date  . ABDOMINAL HYSTERECTOMY     total  . WRIST SURGERY Right    carpel tunnel yrs ago    OB History    No data available        Home Medications    Prior to Admission medications   Medication Sig Start Date End Date Taking? Authorizing Provider  acetaminophen (TYLENOL) 325 MG tablet Take 2 tablets (650 mg total) by mouth every 6 (six) hours as needed for mild pain or moderate pain. 06/20/17   Armelia Penton A, PA-C  Artificial Tear Ointment (ARTIFICIAL TEARS) ointment Place 1 drop into both eyes as needed (dry eyes).    [provider]  aspirin 81 MG tablet Take 81 mg by mouth daily.    [provider]  B Complex-C (B-COMPLEX WITH VITAMIN C) tablet Take 1 tablet by mouth daily.    [provider]  cholecalciferol (VITAMIN D) 1000 UNITS tablet Take 1,000 Units by mouth daily.    [provider]  Flaxseed, Linseed, (FLAXSEED OIL) 1000 MG CAPS Take 1 capsule by mouth daily.    [provider]  furosemide (LASIX) 40 MG tablet Take 40 mg by mouth daily.    [provider]  ibuprofen (ADVIL,MOTRIN) 600 MG tablet Take 1 tablet (600 mg total) by mouth every 6 (six) hours as needed. 06/20/17   Kellis Topete A, PA-C  lisinopril-hydrochlorothiazide (PRINZIDE,ZESTORETIC) 20-25 MG tablet lisinopril 20 mg-hydrochlorothiazide 25 mg tablet    [provider]  magnesium oxide (MAG-OX) 400 MG tablet Take 400 mg by mouth daily.    [provider]  meclizine (ANTIVERT) 25 MG tablet 25 mg.    [provider]  naproxen sodium (ANAPROX) 220 MG tablet Take 220 mg by mouth as needed.  [provider]  omega-3 acid ethyl esters (LOVAZA) 1 G capsule Take 1 g by mouth daily.    [provider]  omeprazole (PRILOSEC) 20 MG capsule Take 1 capsule (20 mg total) by mouth daily. 30 minutes before breakfast 04/04/17   Lyndal Pulley, MD  potassium chloride SA (K-DUR,KLOR-CON) 20 MEQ tablet Take 20 mEq by mouth daily. 03/18/17   [provider]  vitamin B-12 (CYANOCOBALAMIN) 100 MCG tablet Take 100 mcg by mouth daily.    [provider]   vitamin C (ASCORBIC ACID) 250 MG tablet Take 250 mg by mouth daily.    [provider]    Family History Family History  Problem Relation Age of Onset  . Diabetes Mother   . Hypertension Mother   . Prostate cancer Father   . Other Father        heart surgery  . Heart attack Paternal Grandmother   . Diabetes Sister   . Hypertension Brother   . Cancer Brother        in 1 brother, unknown type    Social History Social History  Substance Use Topics  . Smoking status: Former Smoker    Quit date: 12/24/1971  . Smokeless tobacco: Never Used  . Alcohol use No     Allergies   Patient has no known allergies.   Review of Systems Review of Systems  Constitutional: Negative for chills and fever.  Musculoskeletal: Positive for arthralgias (right shoulder). Negative for back pain, gait problem, joint swelling and neck pain.  Neurological: Negative for syncope, weakness, numbness (fingers of the right hand ) and headaches.     Physical Exam Updated Vital Signs BP (!) 148/75 (BP Location: Left Arm)   Pulse (!) 58   Temp 97.6 F (36.4 C) (Oral)   Resp 18   Ht 4\' 8"  (1.422 m)   Wt 75.8 kg (167 lb)   SpO2 98%   BMI 37.44 kg/m   Physical Exam  Constitutional: She is oriented to person, place, and time. She appears well-developed and well-nourished.  HENT:  Head: Normocephalic and atraumatic.  Right Ear: External ear normal.  Left Ear: External ear normal.  Eyes: EOM are normal. Pupils are equal, round, and reactive to light.  Neck: Normal range of motion. Neck supple. No JVD present. No tracheal deviation present.  No midline spine TTP, no paraspinal muscle tenderness  Cardiovascular: Normal rate, regular rhythm and intact distal pulses.   2+ radial pulses bilaterally  Pulmonary/Chest: Effort normal and breath sounds normal. She exhibits no tenderness.  Abdominal: Soft. Bowel sounds are normal. She exhibits no distension.  Musculoskeletal: She exhibits tenderness  and deformity. She exhibits no edema.  Obvious deformity of the right shoulder. Shoulder is tender to palpation in the right deltoid region. No tenderness to palpation, crepitus, or deformity noted to the right clavicle. Limited range of motion of the right lower extremity due to pain. 5/5 strength of biceps, triceps, and wrist however with pain in the shoulder. No midline spine TTP, no paraspinal muscle tenderness, no deformity, crepitus, or step-off noted.  Neurological: She is alert and oriented to person, place, and time.  Fluent speech, no facial droop, sensation intact to light touch of BLE  Skin: Skin is warm and dry. Capillary refill takes less than 2 seconds.  Psychiatric: She has a normal mood and affect. Her behavior is normal.  Nursing note and vitals reviewed.    ED Treatments / Results  DIAGNOSTIC STUDIES: Oxygen Saturation is 100%  on RA, normal by my interpretation.   COORDINATION OF CARE: 2:27 PM Discussed treatment plan with pt at bedside and pt agreed to plan, which includes imaging.   Labs (all labs ordered are listed, but only abnormal results are displayed) Labs Reviewed - No data to display  EKG  EKG Interpretation None       Radiology Dg Shoulder Right  Result Date: 06/20/2017 CLINICAL DATA:  Pain after fall. EXAM: RIGHT SHOULDER - 2+ VIEW COMPARISON:  None. FINDINGS: Two frontal views were obtained. No transscapular Y or axillary view is noted. The humeral head is dislocated inferiorly and probably anteriorly. No definitive fractures. IMPRESSION: Dislocation of the humeral head inferiorly and probably anteriorly. Evaluation is limited without a transscapular Y or axillary view. Recommend relocation and a follow-up three-view shoulder x-ray series for more complete evaluation. Electronically Signed   By: Gerome Sam III M.D   On: 06/20/2017 14:23   Dg Shoulder Right Portable  Result Date: 06/20/2017 CLINICAL DATA:  Postreduction for right shoulder  dislocation. EXAM: PORTABLE RIGHT SHOULDER COMPARISON:  Earlier the same day. FINDINGS: Two-view portable study 1600 hours shows interval reduction of the right humeral head dislocation. Acromioclavicular and coracoclavicular distances are preserved. IMPRESSION: Interval reduction of humeral head dislocation. Electronically Signed   By: Kennith Center M.D.   On: 06/20/2017 16:19    Procedures Procedures (including critical care time)  Medications Ordered in ED Medications  fentaNYL (SUBLIMAZE) injection 50 mcg (50 mcg Nasal Given 06/20/17 1542)  HYDROcodone-acetaminophen (NORCO/VICODIN) 5-325 MG per tablet 1 tablet (1 tablet Oral Given 06/20/17 1643)     Initial Impression / Assessment and Plan / ED Course  I have reviewed the triage vital signs and the nursing notes.  Pertinent labs & imaging results that were available during my care of the patient were reviewed by me and considered in my medical decision making (see chart for details).     Patient with inferior unlikely anterior dislocation of right shoulder. No evidence of fracture neurovascularly intact. Examination limited due to pain. X-rays reviewed by me confirm dislocation. Patient elected to forego conscious sedation, and shoulder was successfully reduced while in the ED under the supervision of Dr. Rhunette Croft, patient received fentanyl for pain control. Patient placed in sling, discussed RICE therapy and follow-up with orthopedics in the next 1-2 weeks. Discussed indications for return to the ED immediately. Pt and patient's family verbalized understanding of and agreement with plan and pt is safe for discharge home at this time.  Patient seen and evaluated by Dr. Rhunette Croft, who agrees with assessment and plan.   Final Clinical Impressions(s) / ED Diagnoses   Final diagnoses:  Shoulder dislocation, right, initial encounter    New Prescriptions Discharge Medication List as of 06/20/2017  4:15 PM    START taking these medications    Details  acetaminophen (TYLENOL) 325 MG tablet Take 2 tablets (650 mg total) by mouth every 6 (six) hours as needed for mild pain or moderate pain., Starting Fri 06/20/2017, Print    ibuprofen (ADVIL,MOTRIN) 600 MG tablet Take 1 tablet (600 mg total) by mouth every 6 (six) hours as needed., Starting Fri 06/20/2017, Print       I personally performed the services described in this documentation, which was scribed in my presence. The recorded information has been reviewed and is accurate.    Jeanie Sewer, PA-C 06/20/17 2025    Derwood Kaplan, MD 06/21/17 [phone]

## 2017-06-20 NOTE — ED Notes (Signed)
Bed: WA05 Expected date:  Expected time:  Means of arrival:  Comments: Triage 8 

## 2017-06-20 NOTE — Discharge Instructions (Addendum)
Call Dr. Roda ShuttersXu for an appointment in 7-10 days. Take the medicine prescribed for pain. Ice the shoulder 3 or 4 times a day and keep the arm in the immobilizer until seen and evaluated by Dr. Roda ShuttersXu.  Please do simple range of motion exercises only.

## 2017-06-23 ENCOUNTER — Telehealth: Payer: Self-pay | Admitting: Diagnostic Neuroimaging

## 2017-06-23 NOTE — Telephone Encounter (Signed)
Spoke to pt and relayed that [facility] would have any upcoming [facility]ts [facility].  She has one with us on 09-12-17 at 0930.  She will contact her pcp about her seeing ortho? About her shoulder dislocation.  She verbalized understanding.

## 2017-06-23 NOTE — Telephone Encounter (Signed)
Patient called office reference to ED visit on Friday 06/20/17.  Patient seen Dr. Marjory LiesPenumalli in hospital and discharge instructions instructed patient [place] and patient states she can not travel that far.  Patient has appointment with Dr. Marjory LiesPenumalli 09/12/17 for follow up visit.  Please call.

## 2017-07-16 ENCOUNTER — Ambulatory Visit: Payer: Medicare Other | Admitting: Neurology

## 2017-09-12 ENCOUNTER — Ambulatory Visit: Payer: Medicare Other | Admitting: Diagnostic Neuroimaging

## 2018-06-02 IMAGING — DX DG CHEST 1V PORT
1 series · 1 of 1 positions shown · non-contrast
Comparison: Chest radiograph performed 02/05/2008

CLINICAL DATA: Acute onset of generalized fatigue and dizziness.
Initial encounter.

EXAM:
PORTABLE CHEST 1 VIEW

[chest ap]
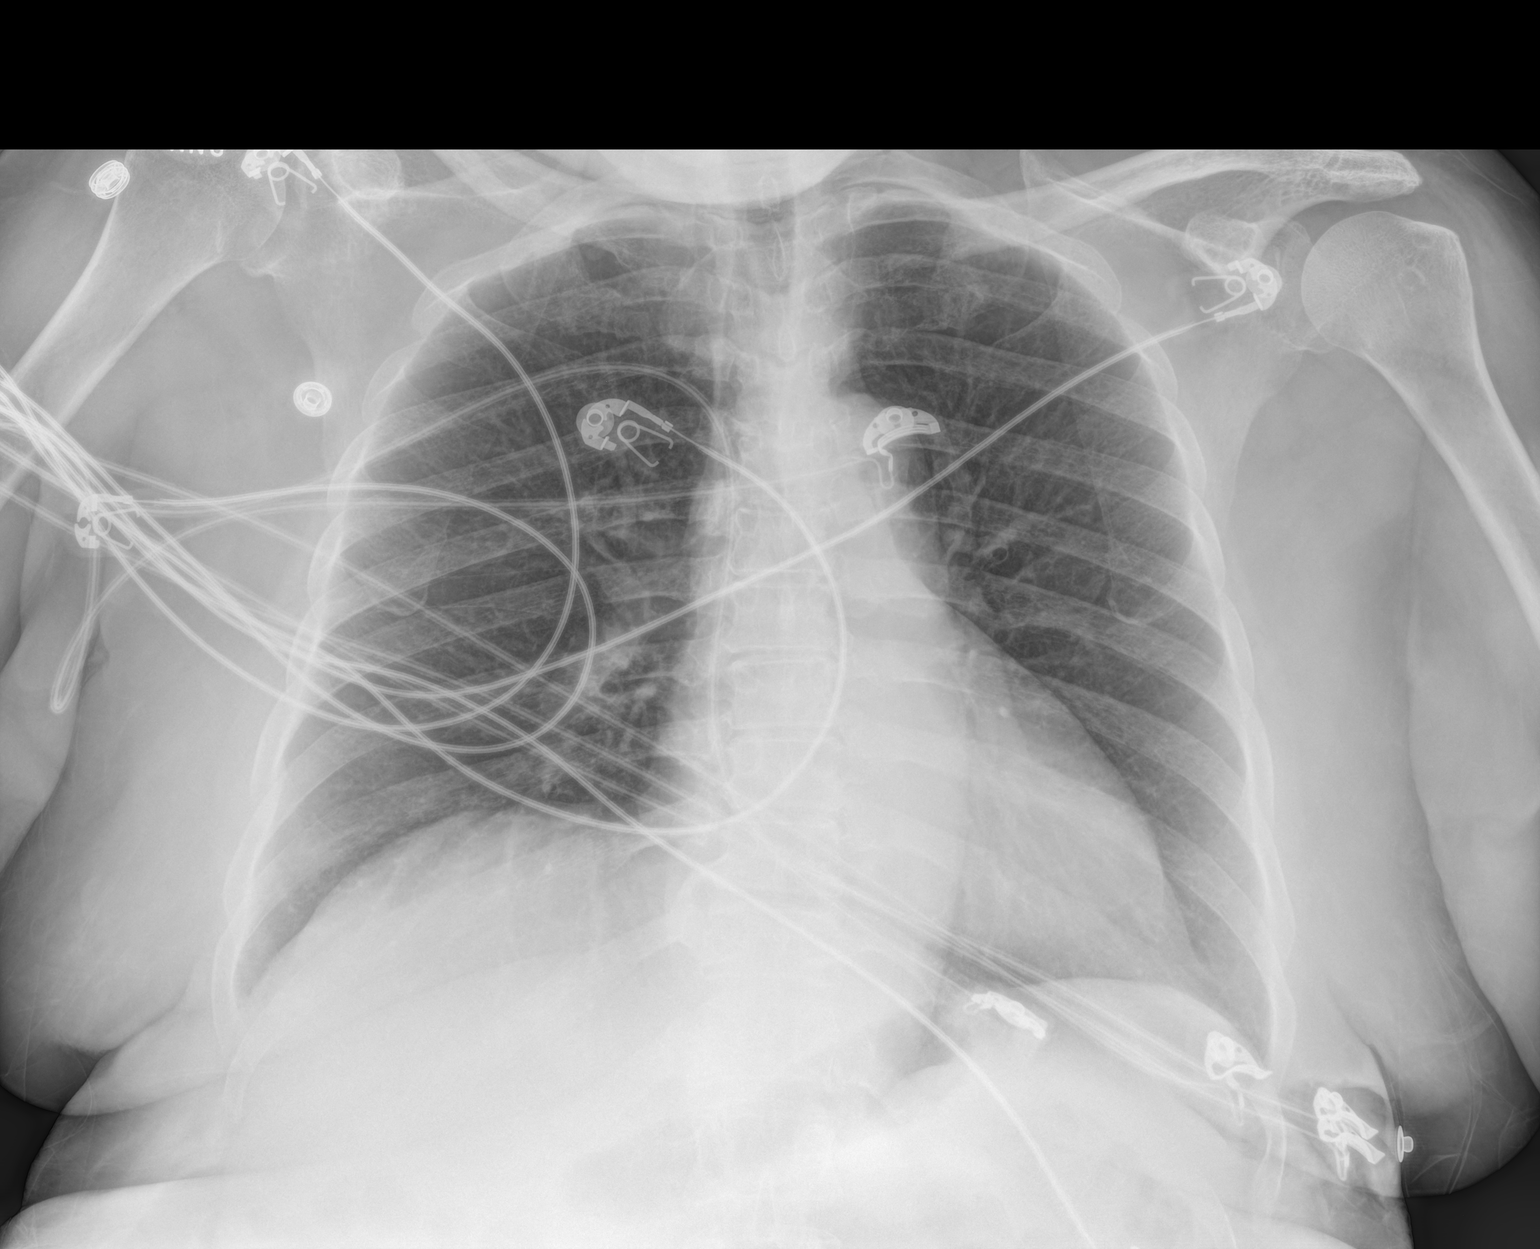

[1 of 1 positions shown; findings below may reference images not displayed]

FINDINGS: The lungs are well-aerated. There is mild elevation of the right
hemidiaphragm. There is no evidence of focal opacification, pleural
effusion or pneumothorax.

The cardiomediastinal silhouette is borderline normal in size. No
acute osseous abnormalities are seen.
IMPRESSION: Mild elevation of the right hemidiaphragm. Lungs remain grossly
clear.

## 2018-07-10 ENCOUNTER — Other Ambulatory Visit: Payer: Self-pay | Admitting: Internal Medicine

## 2018-07-10 DIAGNOSIS — M5416 Radiculopathy, lumbar region: Secondary | ICD-10-CM

## 2018-08-08 ENCOUNTER — Encounter (HOSPITAL_COMMUNITY): Payer: Self-pay

## 2018-08-08 ENCOUNTER — Observation Stay (HOSPITAL_COMMUNITY)
Admission: EM | Admit: 2018-08-08 | Discharge: 2018-08-09 | Disposition: A | Discharge status: Home / Self Care | Payer: Medicare Other | Attending: Internal Medicine | Admitting: Internal Medicine

## 2018-08-08 ENCOUNTER — Other Ambulatory Visit: Payer: Self-pay

## 2018-08-08 ENCOUNTER — Emergency Department (HOSPITAL_COMMUNITY): Payer: Medicare Other

## 2018-08-08 DIAGNOSIS — Z90710 Acquired absence of both cervix and uterus: Secondary | ICD-10-CM | POA: Insufficient documentation

## 2018-08-08 DIAGNOSIS — E669 Obesity, unspecified: Secondary | ICD-10-CM | POA: Insufficient documentation

## 2018-08-08 DIAGNOSIS — R079 Chest pain, unspecified: Secondary | ICD-10-CM | POA: Diagnosis present

## 2018-08-08 DIAGNOSIS — Z87891 Personal history of nicotine dependence: Secondary | ICD-10-CM | POA: Insufficient documentation

## 2018-08-08 DIAGNOSIS — R600 Localized edema: Secondary | ICD-10-CM | POA: Insufficient documentation

## 2018-08-08 DIAGNOSIS — Z8042 Family history of malignant neoplasm of prostate: Secondary | ICD-10-CM | POA: Insufficient documentation

## 2018-08-08 DIAGNOSIS — Z6836 Body mass index (BMI) 36.0-36.9, adult: Secondary | ICD-10-CM | POA: Diagnosis not present

## 2018-08-08 DIAGNOSIS — N390 Urinary tract infection, site not specified: Secondary | ICD-10-CM | POA: Insufficient documentation

## 2018-08-08 DIAGNOSIS — I110 Hypertensive heart disease with heart failure: Secondary | ICD-10-CM | POA: Diagnosis not present

## 2018-08-08 DIAGNOSIS — E785 Hyperlipidemia, unspecified: Secondary | ICD-10-CM | POA: Insufficient documentation

## 2018-08-08 DIAGNOSIS — R001 Bradycardia, unspecified: Secondary | ICD-10-CM

## 2018-08-08 DIAGNOSIS — I5032 Chronic diastolic (congestive) heart failure: Secondary | ICD-10-CM | POA: Insufficient documentation

## 2018-08-08 DIAGNOSIS — R55 Syncope and collapse: Principal | ICD-10-CM | POA: Insufficient documentation

## 2018-08-08 DIAGNOSIS — Z7982 Long term (current) use of aspirin: Secondary | ICD-10-CM | POA: Diagnosis not present

## 2018-08-08 DIAGNOSIS — Z833 Family history of diabetes mellitus: Secondary | ICD-10-CM | POA: Diagnosis not present

## 2018-08-08 DIAGNOSIS — L299 Pruritus, unspecified: Secondary | ICD-10-CM | POA: Diagnosis not present

## 2018-08-08 DIAGNOSIS — K219 Gastro-esophageal reflux disease without esophagitis: Secondary | ICD-10-CM | POA: Insufficient documentation

## 2018-08-08 DIAGNOSIS — Z8249 Family history of ischemic heart disease and other diseases of the circulatory system: Secondary | ICD-10-CM | POA: Insufficient documentation

## 2018-08-08 DIAGNOSIS — Z809 Family history of malignant neoplasm, unspecified: Secondary | ICD-10-CM | POA: Insufficient documentation

## 2018-08-08 DIAGNOSIS — I10 Essential (primary) hypertension: Secondary | ICD-10-CM | POA: Diagnosis present

## 2018-08-08 DIAGNOSIS — R7611 Nonspecific reaction to tuberculin skin test without active tuberculosis: Secondary | ICD-10-CM | POA: Diagnosis not present

## 2018-08-08 DIAGNOSIS — Z79899 Other long term (current) drug therapy: Secondary | ICD-10-CM | POA: Insufficient documentation

## 2018-08-08 LAB — BASIC METABOLIC PANEL
ANION GAP: 14 (ref 5–15)
BUN: 15 mg/dL (ref 8–23)
CHLORIDE: 102 mmol/L (ref 98–111)
CO2: 26 mmol/L (ref 22–32)
CREATININE: 0.98 mg/dL (ref 0.44–1.00)
Calcium: 9.3 mg/dL (ref 8.9–10.3)
GFR calc non Af Amer: 55 mL/min — ABNORMAL LOW (ref >60)
GLUCOSE: 92 mg/dL (ref 70–99)
Potassium: 3.5 mmol/L (ref 3.5–5.1)
SODIUM: 142 mmol/L (ref 135–145)

## 2018-08-08 LAB — I-STAT TROPONIN, ED: TROPONIN I, POC: 0.01 ng/mL (ref 0.00–0.08)

## 2018-08-08 LAB — URINALYSIS, ROUTINE W REFLEX MICROSCOPIC
BILIRUBIN URINE: NEGATIVE
Bacteria, UA: NONE SEEN
Glucose, UA: NEGATIVE mg/dL
Hgb urine dipstick: NEGATIVE
Ketones, ur: NEGATIVE mg/dL
Nitrite: NEGATIVE
Protein, ur: NEGATIVE mg/dL
Specific Gravity, Urine: 1.01 (ref 1.005–1.030)
pH: 5 (ref 5.0–8.0)

## 2018-08-08 LAB — CBC
HEMATOCRIT: 40.6 % (ref 36.0–46.0)
Hemoglobin: 13.6 g/dL (ref 12.0–15.0)
MCH: 27.9 pg (ref 26.0–34.0)
MCHC: 33.5 g/dL (ref 30.0–36.0)
MCV: 83.2 fL (ref 78.0–100.0)
PLATELETS: 334 10*3/uL (ref 150–400)
RBC: 4.88 MIL/uL (ref 3.87–5.11)
RDW: 14.9 % (ref 11.5–15.5)
WBC: 8.8 10*3/uL (ref 4.0–10.5)

## 2018-08-08 LAB — BRAIN NATRIURETIC PEPTIDE: B NATRIURETIC PEPTIDE 5: 17.5 pg/mL (ref 0.0–100.0)

## 2018-08-08 LAB — HEPATIC FUNCTION PANEL
ALBUMIN: 3.8 g/dL (ref 3.5–5.0)
ALT: 17 U/L (ref 0–44)
AST: 23 U/L (ref 15–41)
Alkaline Phosphatase: 71 U/L (ref 38–126)
BILIRUBIN INDIRECT: 0.6 mg/dL (ref 0.3–0.9)
Bilirubin, Direct: 0.1 mg/dL (ref 0.0–0.2)
TOTAL PROTEIN: 7.6 g/dL (ref 6.5–8.1)
Total Bilirubin: 0.7 mg/dL (ref 0.3–1.2)

## 2018-08-08 LAB — LIPASE, BLOOD: Lipase: 26 U/L (ref 11–51)

## 2018-08-08 LAB — MAGNESIUM: Magnesium: 2.1 mg/dL (ref 1.7–2.4)

## 2018-08-08 LAB — CBG MONITORING, ED: GLUCOSE-CAPILLARY: 88 mg/dL (ref 70–99)

## 2018-08-08 NOTE — ED Notes (Signed)
ED TO INPATIENT HANDOFF REPORT  Name/Age/Gender Sheila Logan 77 y.o. female  Code Status Code Status History    Date Active Date Inactive Code Status Order ID Comments User Context   04/05/2017 0420 04/06/2017 1958 Full Code [id number]  Norval Morton, MD ED      Home/SNF/Other Home  Chief Complaint Low Blood Pressue/ Pulse/ Faint   Level of Care/Admitting Diagnosis ED Disposition    None      Medical History Past Medical History:  Diagnosis Date  . Hypertension   . LTBI (latent tuberculosis infection)   . Rotator cuff disorder    right  . Uveitis     Allergies No Known Allergies  IV Location/Drains/Wounds Patient Lines/Drains/Airways Status   Active Line/Drains/Airways    Name:   Placement date:   Placement time:   Site:   Days:   Peripheral IV 04/05/17 Left;Lateral Forearm   04/05/17    2132    Forearm   490          Labs/Imaging Results for orders placed or performed during the hospital encounter of 08/08/18 (from the past 48 hour(s))  Basic metabolic panel     Status: Abnormal   Collection Time: 08/08/18  8:35 PM  Result Value Ref Range   Sodium 142 135 - 145 mmol/L   Potassium 3.5 3.5 - 5.1 mmol/L   Chloride 102 98 - 111 mmol/L   CO2 26 22 - 32 mmol/L   Glucose, Bld 92 70 - 99 mg/dL   BUN 15 8 - 23 mg/dL   Creatinine, Ser 0.98 0.44 - 1.00 mg/dL   Calcium 9.3 8.9 - 10.3 mg/dL   GFR calc non Af Amer 55 (L) >60 mL/min   GFR calc Af Amer >60 >60 mL/min    Comment: (NOTE) The eGFR has been calculated using the CKD EPI equation. This calculation has not been validated in all clinical situations. eGFR's persistently <60 mL/min signify possible Chronic Kidney Disease.    Anion gap 14 5 - 15    Comment: Performed at [facility],  [address], [place], [place] [address]  CBC     Status: None   Collection Time: 08/08/18  8:35 PM  Result Value Ref Range   WBC 8.8 4.0 - 10.5 K/uL   RBC 4.88 3.87 - 5.11 MIL/uL    Hemoglobin 13.6 12.0 - 15.0 g/dL   HCT 40.6 36.0 - 46.0 %   MCV 83.2 78.0 - 100.0 fL   MCH 27.9 26.0 - 34.0 pg   MCHC 33.5 30.0 - 36.0 g/dL   RDW 14.9 11.5 - 15.5 %   Platelets 334 150 - 400 K/uL    Comment: Performed at [facility], [place] [address], [place], [place] [address]  Urinalysis, Routine w reflex microscopic     Status: Abnormal   Collection Time: 08/08/18  8:35 PM  Result Value Ref Range   Color, Urine YELLOW YELLOW   APPearance CLEAR CLEAR   Specific Gravity, Urine 1.010 1.005 - 1.030   pH 5.0 5.0 - 8.0   Glucose, UA NEGATIVE NEGATIVE mg/dL   Hgb urine dipstick NEGATIVE NEGATIVE   Bilirubin Urine NEGATIVE NEGATIVE   Ketones, ur NEGATIVE NEGATIVE mg/dL   Protein, ur NEGATIVE NEGATIVE mg/dL   Nitrite NEGATIVE NEGATIVE   Leukocytes, UA TRACE (A) NEGATIVE   WBC, UA 0-5 0 - 5 WBC/hpf   Bacteria, UA NONE SEEN NONE SEEN   Squamous Epithelial / LPF 0-5 0 - 5  Mucus PRESENT    Hyaline Casts, UA PRESENT     Comment: Performed at [facility], [place] [address], [place], [place] [address]  Hepatic function panel     Status: None   Collection Time: 08/08/18  8:35 PM  Result Value Ref Range   Total Protein 7.6 6.5 - 8.1 g/dL   Albumin 3.8 3.5 - 5.0 g/dL   AST 23 15 - 41 U/L   ALT 17 0 - 44 U/L   Alkaline Phosphatase 71 38 - 126 U/L   Total Bilirubin 0.7 0.3 - 1.2 mg/dL   Bilirubin, Direct 0.1 0.0 - 0.2 mg/dL   Indirect Bilirubin 0.6 0.3 - 0.9 mg/dL    Comment: Performed at [facility], [place] [address], [place], [place] [address]  Magnesium     Status: None   Collection Time: 08/08/18  8:35 PM  Result Value Ref Range   Magnesium 2.1 1.7 - 2.4 mg/dL    Comment: Performed at [facility], [place] [address], [place], [place] [address]  Lipase, blood     Status: None   Collection Time: 08/08/18  8:35 PM  Result Value Ref Range   Lipase 26 11 - 51 U/L    Comment: Performed at [facility], [place] [address] [place], [place] [address]  Brain natriuretic peptide     Status: None   Collection Time: 08/08/18  8:35 PM  Result Value Ref Range   B Natriuretic Peptide 17.5 0.0 - 100.0 pg/mL    Comment: Performed at [facility], [place] [address], [place], [place] [address]  I-stat troponin, ED     Status: None   Collection Time: 08/08/18  9:32 PM  Result Value Ref Range   Troponin i, poc 0.01 0.00 - 0.08 ng/mL   Comment 3            Comment: Due to the release kinetics of cTnI, a negative result within the first hours of the onset of symptoms does not rule out myocardial infarction with certainty. If myocardial infarction is still suspected, repeat the test at appropriate intervals.   CBG monitoring, ED     Status: None   Collection Time: 08/08/18 10:07 PM  Result Value Ref Range   Glucose-Capillary 88 70 - 99 mg/dL   Dg Chest 2 View  Result Date: 08/08/2018 CLINICAL DATA:  Syncopal episode today. Chest pain. Low blood pressure. Swelling in the legs. Former smoker. EXAM: CHEST - 2 VIEW COMPARISON:  04/05/2017 FINDINGS: The heart size and mediastinal contours are within normal limits. Both lungs are clear. The visualized skeletal structures are unremarkable. IMPRESSION: No active cardiopulmonary disease. Electronically Signed   By: Lucienne Capers M.D.   On: 08/08/2018 22:01    Pending Labs Unresulted Labs (From admission, onward)    Start     Ordered   08/08/18 2119  Urine culture  STAT,   STAT     08/08/18 2119          Vitals/Pain Today's Vitals   08/08/18 2200 08/08/18 2230 08/08/18 2300 08/08/18 2330  BP: 132/88 136/74 131/68 (!) 141/77  Pulse: 61 (!) 59 63 63  Resp: '11 13 13 17  ' Temp:      TempSrc:      SpO2: 100% 100% 99% 99%  Weight:      Height:      PainSc:        Isolation Precautions No active isolations  Medications Medications -  No data to display  Mobility walks with device

## 2018-08-08 NOTE — ED Provider Notes (Signed)
[facility]-EMERGENCY DEPT Provider Note   CSN: [id number] Arrival date & time: 08/08/18  1940     History   Chief Complaint Chief Complaint  Patient presents with  . Loss of Consciousness    HPI Sheila Logan is a 77 y.o. female.  The history is provided by the patient, the spouse and medical records.  Loss of Consciousness   This is a recurrent problem. The current episode started less than 1 hour ago. The problem occurs rarely. The problem has been resolved. She lost consciousness for a period of greater than 5 minutes. The problem is associated with normal activity. Associated symptoms include chest pain, light-headedness and malaise/fatigue. Pertinent negatives include abdominal pain, back pain, bladder incontinence, bowel incontinence, confusion, congestion, diaphoresis, dizziness, fever, nausea, palpitations, seizures, vomiting and weakness. She has tried nothing for the symptoms. Her past medical history is significant for HTN.    Past Medical History:  Diagnosis Date  . Hypertension   . LTBI (latent tuberculosis infection)   . Rotator cuff disorder    right  . Uveitis     Patient Active Problem List   Diagnosis Date Noted  . UTI (urinary tract infection) 04/05/2017  . Syncope 04/05/2017  . Orthostatic hypotension 04/05/2017  . Pruritic rash 04/05/2017  . LTBI (latent tuberculosis infection) 01/31/2015  . HTN (hypertension) 01/31/2015    Past Surgical History:  Procedure Laterality Date  . ABDOMINAL HYSTERECTOMY     total  . WRIST SURGERY Right    carpel tunnel yrs ago     OB History   None      Home Medications    Prior to Admission medications   Medication Sig Start Date End Date Taking? Authorizing Provider  acetaminophen (TYLENOL) 325 MG tablet Take 2 tablets (650 mg total) by mouth every 6 (six) hours as needed for mild pain or moderate pain. 06/20/17   Fawze, Mina A, PA-C  Artificial Tear Ointment (ARTIFICIAL  TEARS) ointment Place 1 drop into both eyes as needed (dry eyes).    [provider]  aspirin 81 MG tablet Take 81 mg by mouth daily.    [provider]  B Complex-C (B-COMPLEX WITH VITAMIN C) tablet Take 1 tablet by mouth daily.    [provider]  cholecalciferol (VITAMIN D) 1000 UNITS tablet Take 1,000 Units by mouth daily.    [provider]  Flaxseed, Linseed, (FLAXSEED OIL) 1000 MG CAPS Take 1 capsule by mouth daily.    [provider]  furosemide (LASIX) 40 MG tablet Take 40 mg by mouth daily.    [provider]  ibuprofen (ADVIL,MOTRIN) 600 MG tablet Take 1 tablet (600 mg total) by mouth every 6 (six) hours as needed. 06/20/17   Fawze, Mina A, PA-C  lisinopril-hydrochlorothiazide (PRINZIDE,ZESTORETIC) 20-25 MG tablet lisinopril 20 mg-hydrochlorothiazide 25 mg tablet    [provider]  magnesium oxide (MAG-OX) 400 MG tablet Take 400 mg by mouth daily.    [provider]  meclizine (ANTIVERT) 25 MG tablet 25 mg.    [provider]  naproxen sodium (ANAPROX) 220 MG tablet Take 220 mg by mouth as needed.    [provider]  omega-3 acid ethyl esters (LOVAZA) 1 G capsule Take 1 g by mouth daily.    [provider]  omeprazole (PRILOSEC) 20 MG capsule Take 1 capsule (20 mg total) by mouth daily. 30 minutes before breakfast 04/04/17   Lyndal PulleyKnott, Daniel, MD  potassium chloride SA (K-DUR,KLOR-CON) 20 MEQ  tablet Take 20 mEq by mouth daily. 03/18/17   [provider]  vitamin B-12 (CYANOCOBALAMIN) 100 MCG tablet Take 100 mcg by mouth daily.    [provider]  vitamin C (ASCORBIC ACID) 250 MG tablet Take 250 mg by mouth daily.    [provider]    Family History Family History  Problem Relation Age of Onset  . Diabetes Mother   . Hypertension Mother   . Prostate cancer Father   . Other Father        heart surgery  . Heart attack Paternal Grandmother   . Diabetes Sister   .  Hypertension Brother   . Cancer Brother        in 1 brother, unknown type    Social History Social History   Tobacco Use  . Smoking status: Former Smoker    Last attempt to quit: 12/24/1971    Years since quitting: 46.6  . Smokeless tobacco: Never Used  Substance Use Topics  . Alcohol use: No    Alcohol/week: 0.0 standard drinks  . Drug use: No     Allergies   Patient has no known allergies.   Review of Systems Review of Systems  Constitutional: Positive for fatigue and malaise/fatigue. Negative for chills, diaphoresis and fever.  HENT: Negative for congestion.   Eyes: Negative for visual disturbance.  Respiratory: Negative for cough, chest tightness, shortness of breath and wheezing.   Cardiovascular: Positive for chest pain, leg swelling and syncope. Negative for palpitations.  Gastrointestinal: Negative for abdominal pain, bowel incontinence, constipation, diarrhea, nausea and vomiting.  Genitourinary: Positive for frequency. Negative for bladder incontinence and decreased urine volume.  Musculoskeletal: Negative for back pain, neck pain and neck stiffness.  Skin: Negative for rash and wound.  Neurological: Positive for light-headedness. Negative for dizziness, seizures, weakness and numbness.  Psychiatric/Behavioral: Negative for confusion.  All other systems reviewed and are negative.    Physical Exam Updated Vital Signs BP 105/69 (BP Location: Right Arm)   Pulse 92   Temp 98.2 F (36.8 C) (Oral)   Resp 16   Ht 4\' 11"  (1.499 m)   Wt 75.8 kg   SpO2 97%   BMI 33.73 kg/m   Physical Exam  Constitutional: She is oriented to person, place, and time. She appears well-developed and well-nourished. No distress.  HENT:  Head: Normocephalic and atraumatic.  Right Ear: External ear normal.  Left Ear: External ear normal.  Nose: Nose normal.  Mouth/Throat: Oropharynx is clear and moist. No oropharyngeal exudate.  Eyes: Pupils are equal, round, and reactive to  light. Conjunctivae and EOM are normal.  Neck: Normal range of motion. Neck supple.  Cardiovascular: Normal rate and intact distal pulses.  Murmur heard. Pulmonary/Chest: No stridor. No respiratory distress. She has no wheezes. She exhibits no tenderness.  Abdominal: She exhibits no distension. There is no tenderness. There is no rebound and no guarding.  Musculoskeletal: She exhibits edema. She exhibits no tenderness.  Neurological: She is alert and oriented to person, place, and time. She has normal reflexes. No cranial nerve deficit or sensory deficit. She exhibits normal muscle tone. Coordination normal.  Skin: Skin is warm. Capillary refill takes less than 2 seconds. No rash noted. She is not diaphoretic. No erythema.  Nursing note and vitals reviewed.    ED Treatments / Results  Labs (all labs ordered are listed, but only abnormal results are displayed) Labs Reviewed  BASIC METABOLIC PANEL - Abnormal; Notable for the following components:  Result Value   GFR calc non Af Amer 55 (*)    All other components within normal limits  URINALYSIS, ROUTINE W REFLEX MICROSCOPIC - Abnormal; Notable for the following components:   Leukocytes, UA TRACE (*)    All other components within normal limits  URINE CULTURE  CBC  HEPATIC FUNCTION PANEL  MAGNESIUM  LIPASE, BLOOD  BRAIN NATRIURETIC PEPTIDE  CBG MONITORING, ED  I-STAT TROPONIN, ED  I-STAT TROPONIN, ED    EKG EKG Interpretation  Date/Time:  Saturday August 08 2018 20:31:31 EDT Ventricular Rate:  72 PR Interval:    QRS Duration: 89 QT Interval:  406 QTC Calculation: 445 R Axis:   39 Text Interpretation:  Sinus rhythm Right atrial enlargement Baseline wander in lead(s) V2 When compared to prior, no significant changes seen.  No STEMI Confirmed by Theda Belfastegeler, Chris ([id number]) on 08/08/2018 8:46:34 PM   Radiology Dg Chest 2 View  Result Date: 08/08/2018 CLINICAL DATA:  Syncopal episode today. Chest pain. Low blood pressure.  Swelling in the legs. Former smoker. EXAM: CHEST - 2 VIEW COMPARISON:  04/05/2017 FINDINGS: The heart size and mediastinal contours are within normal limits. Both lungs are clear. The visualized skeletal structures are unremarkable. IMPRESSION: No active cardiopulmonary disease. Electronically Signed   By: Burman NievesWilliam  Stevens M.D.   On: 08/08/2018 22:01    Procedures Procedures (including critical care time)  Medications Ordered in ED Medications - No data to display   Initial Impression / Assessment and Plan / ED Course  I have reviewed the triage vital signs and the nursing notes.  Pertinent labs & imaging results that were available during my care of the patient were reviewed by me and considered in my medical decision making (see chart for details).     Sheila Logan is a 77 y.o. female with a past medical history significant for hypertension, prior UTIs, prior syncope, and history of latent TB infection who presents with a syncopal episode, chest pains, and peripheral edema.  Patient reports that she was brought in today due to a syncopal episode she had a birthday party she was attending.  She reports that she was sitting in chair and passed out for between 5 and 10 minutes according to what they told her.  She does not remember what happened.  She was assessed by a nurse at the party who said her blood pressure and heart rate were low prompting EMS to come get her.  She reports that she is been having chest pain on and off for the last month.  It is not exertional or pleuritic.  It is an aching and dullness.  She denies radiation of the pain.  She reports that she has also had urinary frequency.  She reports she has had peripheral edema in both of her legs for the last month.  She reports that she has been taking torsemide to help with this.  She says that she was switched to losartan/HCTZ several months ago and has not had any syncopal episodes.  She says that she has had no cough or  shortness of breath.  No recent trauma.  She denies any alcohol use or other drug use.  She says that she has had some difficulty with swallowing over the last few months and was scheduled to get an endoscopy to evaluate at some point but was deferred due to congestion she was having.  She denies any other complaints on arrival.  On arrival, patient's blood pressure was 105 systolic.  She was not bradycardic on arrival.  Patient's lungs were clear and chest is nontender.  Systolic murmur was heard.  Abdomen was nontender.  Legs were edematous bilaterally which she reports has been new for the last week.  She had no back tenderness and no CVA tenderness.  Patient was alert and oriented.  No focal neurologic deficits were seen.  Patient's initial EKG showed sinus rhythm with no STEMI.  No significant arrhythmias were seen.  Medically I am concerned about the patient's fluid balance given her worsening peripheral edema and her syncopal episodes which could be due to dehydration with her torsemide use.  The patient reported intermittent chest pain she has had for the last few weeks as well as her syncopal episode with reported bradycardia and hypotension is also concerning.  Next  I anticipate patient will likely require admission for high risk syncope after work-up is completed.  11:33 PM Patient's initial laboratory testing began to return reassuring.  Initial troponin was negative.  Urinalysis showed no nitrites or bacteria, doubt infection.  CBC shows no leukocytosis or anemia.  Hepatic function was unremarkable.  Magnesium was normal.  Lipase not elevated.  Chest x-ray shows no acute cardia pulmonary disease.    Patient still awaiting BNP however given the patient's syncopal episode, worsening peripheral edema, reports that she was hypotensive and bradycardic on scene and her occasional bradycardia in the emergency department into the low 50s, I feel patient needs to be admitted for echo and further  monitoring for high risk syncope.  11:58 PM BNP returned nonelevated.  Hospitalist team was called and they requested orthostatics.  Patient will be admitted for further management of her high risk syncope.   Final Clinical Impressions(s) / ED Diagnoses   Final diagnoses:  Syncope and collapse  Chest pain, unspecified type  Bradycardia    ED Discharge Orders    None     Clinical Impression: 1. Syncope and collapse   2. Chest pain, unspecified type   3. Bradycardia     Disposition: Admit  This note was prepared with assistance of Dragon voice recognition software. Occasional wrong-word or sound-a-like substitutions may have occurred due to the inherent limitations of voice recognition software.     Tegeler, Canary Brim, MD 08/08/18 [phone]

## 2018-08-08 NOTE — ED Triage Notes (Signed)
Pt presents to ED after having a syncopal episode at a birthday party. Pt reports that she was sitting down, and the she woke up on the floor with people around her. Pt reports that someone checked her BP and it was low. Pt started on new BP med, and took a dose of torsemide because her legs were more swollen today.

## 2018-08-09 ENCOUNTER — Observation Stay (HOSPITAL_BASED_OUTPATIENT_CLINIC_OR_DEPARTMENT_OTHER): Payer: Medicare Other

## 2018-08-09 DIAGNOSIS — I10 Essential (primary) hypertension: Secondary | ICD-10-CM

## 2018-08-09 DIAGNOSIS — I5032 Chronic diastolic (congestive) heart failure: Secondary | ICD-10-CM

## 2018-08-09 DIAGNOSIS — R55 Syncope and collapse: Secondary | ICD-10-CM

## 2018-08-09 DIAGNOSIS — R600 Localized edema: Secondary | ICD-10-CM | POA: Diagnosis present

## 2018-08-09 DIAGNOSIS — E785 Hyperlipidemia, unspecified: Secondary | ICD-10-CM

## 2018-08-09 LAB — COMPREHENSIVE METABOLIC PANEL
ALBUMIN: 3.4 g/dL — AB (ref 3.5–5.0)
ALT: 14 U/L (ref 0–44)
AST: 16 U/L (ref 15–41)
Alkaline Phosphatase: 60 U/L (ref 38–126)
Anion gap: 9 (ref 5–15)
BILIRUBIN TOTAL: 0.9 mg/dL (ref 0.3–1.2)
BUN: 15 mg/dL (ref 8–23)
CHLORIDE: 105 mmol/L (ref 98–111)
CO2: 27 mmol/L (ref 22–32)
Calcium: 8.7 mg/dL — ABNORMAL LOW (ref 8.9–10.3)
Creatinine, Ser: 0.81 mg/dL (ref 0.44–1.00)
GFR calc Af Amer: >60 mL/min (ref >60)
GLUCOSE: 92 mg/dL (ref 70–99)
POTASSIUM: 3 mmol/L — AB (ref 3.5–5.1)
Sodium: 141 mmol/L (ref 135–145)
TOTAL PROTEIN: 6.5 g/dL (ref 6.5–8.1)

## 2018-08-09 LAB — CBC
HEMATOCRIT: 37.7 % (ref 36.0–46.0)
HEMOGLOBIN: 12.6 g/dL (ref 12.0–15.0)
MCH: 27.8 pg (ref 26.0–34.0)
MCHC: 33.4 g/dL (ref 30.0–36.0)
MCV: 83 fL (ref 78.0–100.0)
Platelets: 300 10*3/uL (ref 150–400)
RBC: 4.54 MIL/uL (ref 3.87–5.11)
RDW: 15 % (ref 11.5–15.5)
WBC: 9.2 10*3/uL (ref 4.0–10.5)

## 2018-08-09 LAB — ECHOCARDIOGRAM COMPLETE
HEIGHTINCHES: 58 in
WEIGHTICAEL: 2766.4 [oz_av]

## 2018-08-09 LAB — GLUCOSE, CAPILLARY
GLUCOSE-CAPILLARY: 88 mg/dL (ref 70–99)
Glucose-Capillary: 80 mg/dL (ref 70–99)

## 2018-08-09 MED ORDER — OMEGA-3-ACID ETHYL ESTERS 1 G PO CAPS
1.0000 g | ORAL_CAPSULE | Freq: Every day | ORAL | Status: DC
  Administered 2018-08-09: 1 g via ORAL
  Filled 2018-08-09: qty 1

## 2018-08-09 MED ORDER — GLYCERIN-HYPROMELLOSE-PEG 400 0.2-0.2-1 % OP SOLN
1.0000 [drp] | OPHTHALMIC | Status: DC | PRN
  Filled 2018-08-09: qty 15

## 2018-08-09 MED ORDER — ADULT MULTIVITAMIN W/MINERALS CH
1.0000 | ORAL_TABLET | Freq: Every day | ORAL | Status: DC
  Administered 2018-08-09: 1 via ORAL
  Filled 2018-08-09: qty 1

## 2018-08-09 MED ORDER — POTASSIUM CHLORIDE CRYS ER 20 MEQ PO TBCR
40.0000 meq | EXTENDED_RELEASE_TABLET | ORAL | Status: DC
  Administered 2018-08-09: 40 meq via ORAL
  Filled 2018-08-09: qty 2

## 2018-08-09 MED ORDER — VITAMIN C 250 MG PO TABS
250.0000 mg | ORAL_TABLET | Freq: Every day | ORAL | Status: DC
  Administered 2018-08-09: 250 mg via ORAL
  Filled 2018-08-09: qty 1

## 2018-08-09 MED ORDER — SIMVASTATIN 20 MG PO TABS
20.0000 mg | ORAL_TABLET | Freq: Every day | ORAL | Status: DC
  Administered 2018-08-09: 20 mg via ORAL
  Filled 2018-08-09: qty 1

## 2018-08-09 MED ORDER — VITAMIN B-12 100 MCG PO TABS
100.0000 ug | ORAL_TABLET | Freq: Every day | ORAL | Status: DC
  Administered 2018-08-09: 100 ug via ORAL
  Filled 2018-08-09: qty 1

## 2018-08-09 MED ORDER — N-ACETYL-L-CYSTEINE 600 MG PO CAPS: 1.0000 | ORAL_CAPSULE | Freq: Every day | ORAL | Status: DC

## 2018-08-09 MED ORDER — SODIUM CHLORIDE 0.9% FLUSH
3.0000 mL | Freq: Two times a day (BID) | INTRAVENOUS | Status: DC
  Administered 2018-08-09 (×2): 3 mL via INTRAVENOUS

## 2018-08-09 MED ORDER — TORSEMIDE 20 MG PO TABS
40.0000 mg | ORAL_TABLET | Freq: Every day | ORAL | Status: DC | PRN
  Filled 2018-08-09: qty 2

## 2018-08-09 MED ORDER — FLAXSEED OIL 1000 MG PO CAPS: 1.0000 | ORAL_CAPSULE | Freq: Every day | ORAL | Status: DC

## 2018-08-09 MED ORDER — ONDANSETRON HCL 4 MG PO TABS
4.0000 mg | ORAL_TABLET | Freq: Four times a day (QID) | ORAL | Status: DC | PRN
  Filled 2018-08-09: qty 1

## 2018-08-09 MED ORDER — SODIUM CHLORIDE 0.45 % IV SOLN
INTRAVENOUS | Status: DC
  Administered 2018-08-09: 02:00:00 via INTRAVENOUS

## 2018-08-09 MED ORDER — POTASSIUM CHLORIDE CRYS ER 20 MEQ PO TBCR: 20.0000 meq | EXTENDED_RELEASE_TABLET | Freq: Every day | ORAL | Status: DC

## 2018-08-09 MED ORDER — B COMPLEX-C PO TABS
1.0000 | ORAL_TABLET | Freq: Every day | ORAL | Status: DC
  Administered 2018-08-09: 1 via ORAL
  Filled 2018-08-09: qty 1

## 2018-08-09 MED ORDER — MAGNESIUM OXIDE 400 (241.3 MG) MG PO TABS
400.0000 mg | ORAL_TABLET | Freq: Every day | ORAL | Status: DC
  Administered 2018-08-09: 400 mg via ORAL
  Filled 2018-08-09: qty 1

## 2018-08-09 MED ORDER — VITAMIN D3 25 MCG (1000 UNIT) PO TABS
1000.0000 [IU] | ORAL_TABLET | Freq: Every day | ORAL | Status: DC
  Administered 2018-08-09: 1000 [IU] via ORAL
  Filled 2018-08-09: qty 1

## 2018-08-09 MED ORDER — MECLIZINE HCL 25 MG PO TABS: 25.0000 mg | ORAL_TABLET | Freq: Three times a day (TID) | ORAL | Status: DC | PRN

## 2018-08-09 MED ORDER — LOSARTAN POTASSIUM 50 MG PO TABS
50.0000 mg | ORAL_TABLET | Freq: Every day | ORAL | Status: DC
  Administered 2018-08-09: 50 mg via ORAL
  Filled 2018-08-09: qty 1

## 2018-08-09 MED ORDER — ASPIRIN EC 81 MG PO TBEC
81.0000 mg | DELAYED_RELEASE_TABLET | Freq: Every day | ORAL | Status: DC
  Administered 2018-08-09: 81 mg via ORAL
  Filled 2018-08-09: qty 1

## 2018-08-09 MED ORDER — OLOPATADINE HCL 0.1 % OP SOLN
1.0000 [drp] | Freq: Every day | OPHTHALMIC | Status: DC
  Administered 2018-08-09: 1 [drp] via OPHTHALMIC
  Filled 2018-08-09: qty 5

## 2018-08-09 MED ORDER — HYDROCHLOROTHIAZIDE 12.5 MG PO CAPS
12.5000 mg | ORAL_CAPSULE | Freq: Every day | ORAL | Status: DC
  Administered 2018-08-09: 12.5 mg via ORAL
  Filled 2018-08-09: qty 1

## 2018-08-09 MED ORDER — ENOXAPARIN SODIUM 40 MG/0.4ML ~~LOC~~ SOLN
40.0000 mg | SUBCUTANEOUS | Status: DC
  Administered 2018-08-09: 40 mg via SUBCUTANEOUS
  Filled 2018-08-09: qty 0.4

## 2018-08-09 MED ORDER — DONEPEZIL HCL 5 MG PO TABS
10.0000 mg | ORAL_TABLET | Freq: Every day | ORAL | Status: DC
  Administered 2018-08-09: 10 mg via ORAL
  Filled 2018-08-09: qty 2

## 2018-08-09 MED ORDER — ONDANSETRON HCL 4 MG/2ML IJ SOLN: 4.0000 mg | Freq: Four times a day (QID) | INTRAMUSCULAR | Status: DC | PRN

## 2018-08-09 MED ORDER — LOSARTAN POTASSIUM-HCTZ 50-12.5 MG PO TABS: 1.0000 | ORAL_TABLET | Freq: Every day | ORAL | Status: DC

## 2018-08-09 MED ORDER — NAPROXEN 250 MG PO TABS
250.0000 mg | ORAL_TABLET | Freq: Two times a day (BID) | ORAL | Status: DC | PRN
  Filled 2018-08-09: qty 1

## 2018-08-09 NOTE — Progress Notes (Signed)
D/W Dr. Noralee StainJennifer Choi, MD. Syncope appears to be vasovagal and less likely cardiogenic. Event monitor with us 30 days and OV with me.   Lower extremity arterial duplex 03/04/2018: No hemodynamically significant stenoses are identified in the lower extremity arterial system. This exam reveals normal perfusion of the right lower extremity (ABI 0.97) and mildly decreased perfusion of the left lower extremity, noted at the post tibial artery level (ABI 0.91). There is mild diffuse plaque bilaterally.  Echocardiogram 03/04/2018: Left ventricle cavity is normal in size. Mild concentric hypertrophy of the left ventricle. Normal global wall motion. Normal diastolic filling pattern. Calculated EF 60%. Mild (Grade I) aortic regurgitation. Mild (Grade I) mitral regurgitation. Mild tricuspid regurgitation. Estimated pulmonary artery systolic pressure 25 mmHg.  Lexiscan myoview stress test 03/02/2018:  1. Pharmacologic stress testing was performed with intravenous administration of .4 mg of Lexiscan over a 10-15 seconds infusion. Stress symptoms included abdominal pain. Peak blood pressure 150/72 mmHg. Exercise capacity not assessed.  Stress EKG is non diagnostic for ischemia as it is a pharmacologic stress.  2. The overall quality of the study is excellent. There is no evidence of abnormal lung activity. Stress and rest SPECT images demonstrate homogeneous tracer distribution throughout the myocardium. Gated SPECT imaging reveals normal myocardial thickening and wall motion. The left ventricular ejection fraction was normal (70%).   3. Low risk study.   Sheila DecampJay Daniela Siebers, MD 08/09/2018, 10:47 AM [facility] Pager: [phone] Office: [phone] If no answer: Cell:  [phone]

## 2018-08-09 NOTE — ED Notes (Addendum)
Took orthostatic VS on left arm for all 3 readings. After 3 minutes of standing, BP measured 162/100 mmHg on the LFT arm. Switched to the right arm and BP measured 170/101 mmHg. MD made aware.

## 2018-08-09 NOTE — Progress Notes (Signed)
  Echocardiogram 2D Echocardiogram has been performed.  Sheila Logan, Sheila Logan 08/09/2018, 10:37 AM

## 2018-08-09 NOTE — Evaluation (Signed)
Physical Therapy Evaluation Patient Details Name: Sheila Logan MRN: [id number] DOB: [birth year] Today's Date: 08/09/2018   History of Present Illness  Patient is a 77 y/o female admitted with syncopal episode.  PMH positive for hypertension, chronic lower extremity edema, GERD, hyperlipidemia as well as previous syncope about 2 years ago   Clinical Impression  Patient presents with decreased mobility due to generalized weakness and some imbalance.  Currently S level for mobility with RW, may need to use walker at home initially due to generalized weakness.  Feel she may also benefit from HHPT initially at d/c to help with progressing strength and independence.     Follow Up Recommendations Home health PT;Supervision - Intermittent    Equipment Recommendations  None recommended by PT    Recommendations for Other Services       Precautions / Restrictions Precautions Precautions: Fall Precaution Comments: though denies recent falls      Mobility  Bed Mobility Overal bed mobility: Modified Independent                Transfers Overall transfer level: Needs assistance Equipment used: None Transfers: Sit to/from Stand Sit to Stand: Supervision         General transfer comment: for safety  Ambulation/Gait Ambulation/Gait assistance: Supervision;Min guard Gait Distance (Feet): 115 Feet Assistive device: Rolling walker (2 wheeled) Gait Pattern/deviations: Step-through pattern;Decreased stride length     General Gait Details: slow, but steady, able to walk short distance in room no device  Stairs            Wheelchair Mobility    Modified Rankin (Stroke Patients Only)       Balance Overall balance assessment: Mild deficits observed, not formally tested                                           Pertinent Vitals/Pain Pain Assessment: No/denies pain    Home Living Family/patient expects to be discharged to:: Private  residence Living Arrangements: Spouse/significant other Available Help at Discharge: Family Type of Home: House Home Access: Stairs to enter   Secretary/administratorntrance Stairs-Number of Steps: 3 Home Layout: Two level;Able to live on main level with bedroom/bathroom Home Equipment: Walker - 4 wheels;Cane - single point      Prior Function Level of Independence: Independent         Comments: only occasionally uses cane     Hand Dominance        Extremity/Trunk Assessment   Upper Extremity Assessment Upper Extremity Assessment: Overall WFL for tasks assessed    Lower Extremity Assessment Lower Extremity Assessment: Generalized weakness       Communication   Communication: No difficulties  Cognition Arousal/Alertness: Awake/alert Behavior During Therapy: WFL for tasks assessed/performed Overall Cognitive Status: Within Functional Limits for tasks assessed                                        General Comments General comments (skin integrity, edema, etc.): spouse in room and reports slow functional decline, but pt still able to do most things for herself, discussed possibly a shower seat or a tub bench for home    Exercises     Assessment/Plan    PT Assessment Patient needs continued PT services  PT Problem List Decreased strength;Decreased mobility;Decreased safety  awareness;Decreased knowledge of use of DME;Decreased balance       PT Treatment Interventions DME instruction;Therapeutic activities;Gait training;Therapeutic exercise;Stair training;Functional mobility training;Patient/family education;Balance training    PT Goals (Current goals can be found in the Care Plan section)  Acute Rehab PT Goals Patient Stated Goal: to return home, get strenth PT Goal Formulation: With patient/family Time For Goal Achievement: 08/23/18 Potential to Achieve Goals: Good    Frequency Min 3X/week   Barriers to discharge        Co-evaluation                AM-PAC PT "6 Clicks" Daily Activity  Outcome Measure Difficulty turning over in bed (including adjusting bedclothes, sheets and blankets)?: A Little Difficulty moving from lying on back to sitting on the side of the bed? : A Little Difficulty sitting down on and standing up from a chair with arms (e.g., wheelchair, bedside commode, etc,.)?: A Little Help needed moving to and from a bed to chair (including a wheelchair)?: A Little Help needed walking in hospital room?: A Little Help needed climbing 3-5 steps with a railing? : A Lot 6 Click Score: 17    End of Session Equipment Utilized During Treatment: Gait belt Activity Tolerance: Patient tolerated treatment well Patient left: in chair;with call bell/phone within reach;with family/visitor present   PT Visit Diagnosis: Other abnormalities of gait and mobility (R26.89);Muscle weakness (generalized) (M62.81)    Time: [id number] PT Time Calculation (min) (ACUTE ONLY): 22 min   Charges:   PT Evaluation $PT Eval Moderate Complexity: [address] Mod          Cyndi [place], [place] [phone] 08/09/2018   Sheila Logan 08/09/2018, 3:46 PM

## 2018-08-09 NOTE — H&P (Signed)
History and Physical    Sheila ForthShirlene Roland Pettey WUJ:[id number] DOB: 15-Jan-1941 DOA: 08/08/2018  Referring MD/NP/PA: Tegeler  PCP: Andi DevonShelton, Kimberly, MD   Outpatient Specialists: Dr Jacinto HalimGanji, cardiology   Patient coming from: Home  Chief Complaint: Passing out  HPI: Sheila Logan is a 77 y.o. female with medical history significant of hypertension, chronic lower extremity edema, GERD, hyperlipidemia as well as previous syncope about 2 years ago who recently saw a cardiologist in March for her lower extremity edema.  Patient says she has some tests done but is not aware of the results yet.  She was at a birthday party tonight sitting be to multiple people when she apparently passed out.  She had no recollection of what happened.  Patient was out for a few minutes.  She was brought to the ER where she is now fully awake alert and back to baseline.  During the episodes her blood pressure was apparently low as well as her heart rate.  EMS of giving her fluids.  She is also read fluids in the ER.  Blood pressure has rebounded and pulse is in the 60s now.  It was in the 40s at the time.  Patient denied any prodromal symptoms.  She has been feeling weird in the last 1 week she said.  No specific symptoms.  Patient has taken some blood pressure medications including some diuretics due to worsening leg edema.  She has chronically had leg edema and has been managing it on and off.  She also has a past history of vertigo and possible dementia.  She takes meclizine on and off.  Patient is on Aricept which she has continued to take.  In the ER she is back to baseline at the moment but she is being admitted for work-up of syncopal episode.  ED Course: Patient's blood pressure on arrival is 105/69 with a pulse rate of 59, slightly orthostatic, other vitals are stable, lab work is completely normal except, initial troponin and EKG were all within normal.  Patient suspected to have had syncope which could be  vasovagal.  She has been admitted for treatment and observation with full work-up.  Chest x-ray is also negative.  Review of Systems: As per HPI otherwise 10 point review of systems negative.    Past Medical History:  Diagnosis Date  . Hypertension   . LTBI (latent tuberculosis infection)   . Rotator cuff disorder    right  . Uveitis     Past Surgical History:  Procedure Laterality Date  . ABDOMINAL HYSTERECTOMY     total  . WRIST SURGERY Right    carpel tunnel yrs ago     reports that she quit smoking about 46 years ago. She has never used smokeless tobacco. She reports that she does not drink alcohol or use drugs.  No Known Allergies  Family History  Problem Relation Age of Onset  . Diabetes Mother   . Hypertension Mother   . Prostate cancer Father   . Other Father        heart surgery  . Heart attack Paternal Grandmother   . Diabetes Sister   . Hypertension Brother   . Cancer Brother        in 1 brother, unknown type    Prior to Admission medications   Medication Sig Start Date End Date Taking? Authorizing Provider  Acetylcysteine (N-ACETYL-L-CYSTEINE) 600 MG CAPS Take 1 capsule by mouth daily.   Yes [provider]  Artificial Tear Ointment (ARTIFICIAL  TEARS) ointment Place 1 drop into both eyes as needed (dry eyes).   Yes [provider]  aspirin 81 MG tablet Take 81 mg by mouth daily.   Yes [provider]  B Complex-C (B-COMPLEX WITH VITAMIN C) tablet Take 1 tablet by mouth daily.   Yes [provider]  cholecalciferol (VITAMIN D) 1000 UNITS tablet Take 1,000 Units by mouth daily.   Yes [provider]  donepezil (ARICEPT) 10 MG tablet Take 10 mg by mouth at bedtime.  07/31/18  Yes [provider]  Flaxseed, Linseed, (FLAXSEED OIL) 1000 MG CAPS Take 1 capsule by mouth daily.   Yes [provider]  losartan-hydrochlorothiazide (HYZAAR) 50-12.5 MG tablet Take 1 tablet by mouth daily.  07/06/18  Yes  [provider]  magnesium oxide (MAG-OX) 400 MG tablet Take 400 mg by mouth daily.   Yes [provider]  meclizine (ANTIVERT) 25 MG tablet Take 25 mg by mouth 3 (three) times daily as needed for dizziness.    Yes [provider]  meloxicam (MOBIC) 15 MG tablet Take 15 mg by mouth daily as needed for pain.   Yes [provider]  Multiple Vitamins-Minerals (MULTIVITAMIN ADULTS) TABS Take 1 tablet by mouth daily.   Yes [provider]  naproxen sodium (ANAPROX) 220 MG tablet Take 440 mg by mouth 2 (two) times daily as needed (pain).    Yes [provider]  olopatadine (PATANOL) 0.1 % ophthalmic solution Place 1 drop into both eyes daily.  06/29/18  Yes [provider]  omega-3 acid ethyl esters (LOVAZA) 1 G capsule Take 1 g by mouth daily.   Yes [provider]  potassium chloride SA (K-DUR,KLOR-CON) 20 MEQ tablet Take 20 mEq by mouth daily. 03/18/17  Yes [provider]  simvastatin (ZOCOR) 20 MG tablet Take 20 mg by mouth daily. 05/09/18  Yes [provider]  torsemide (DEMADEX) 20 MG tablet Take 40 mg by mouth daily as needed (swelling).   Yes [provider]  vitamin B-12 (CYANOCOBALAMIN) 100 MCG tablet Take 100 mcg by mouth daily.   Yes [provider]  vitamin C (ASCORBIC ACID) 250 MG tablet Take 250 mg by mouth daily.   Yes [provider]  acetaminophen (TYLENOL) 325 MG tablet Take 2 tablets (650 mg total) by mouth every 6 (six) hours as needed for mild pain or moderate pain. Patient not taking: Reported on 08/08/2018 06/20/17   Michela PitcherFawze, Mina A, PA-C  ibuprofen (ADVIL,MOTRIN) 600 MG tablet Take 1 tablet (600 mg total) by mouth every 6 (six) hours as needed. Patient not taking: Reported on 08/08/2018 06/20/17   Michela PitcherFawze, Mina A, PA-C  omeprazole (PRILOSEC) 20 MG capsule Take 1 capsule (20 mg total) by mouth daily. 30 minutes before breakfast Patient not taking: Reported on 08/08/2018 04/04/17    Lyndal PulleyKnott, Daniel, MD    Physical Exam: Vitals:   08/08/18 2200 08/08/18 2230 08/08/18 2300 08/08/18 2330  BP: 132/88 136/74 131/68 (!) 141/77  Pulse: 61 (!) 59 63 63  Resp: 11 13 13 17   Temp:      TempSrc:      SpO2: 100% 100% 99% 99%  Weight:      Height:          Constitutional: NAD, calm, comfortable Vitals:   08/08/18 2200 08/08/18 2230 08/08/18 2300 08/08/18 2330  BP: 132/88 136/74 131/68 (!) 141/77  Pulse: 61 (!) 59 63 63  Resp: 11 13 13 17   Temp:  TempSrc:      SpO2: 100% 100% 99% 99%  Weight:      Height:       Eyes: PERRL, lids and conjunctivae normal ENMT: Mucous membranes are moist. Posterior pharynx clear of any exudate or lesions.Normal dentition.  Neck: normal, supple, no masses, no thyromegaly Respiratory: clear to auscultation bilaterally, no wheezing, no crackles. Normal respiratory effort. No accessory muscle use.  Cardiovascular: Regular rate and rhythm, no murmurs / rubs / gallops. 1+ extremity edema. 2+ pedal pulses. No carotid bruits.  Abdomen: no tenderness, no masses palpated. No hepatosplenomegaly. Bowel sounds positive.  Musculoskeletal: no clubbing / cyanosis. No joint deformity upper and lower extremities. Good ROM, no contractures. Normal muscle tone.  Skin: no rashes, lesions, ulcers. No induration Neurologic: CN 2-12 grossly intact. Sensation intact, DTR normal. Strength 5/5 in all 4.  Psychiatric: Normal judgment and insight. Alert and oriented x 3. Normal mood.   Labs on Admission: I have personally reviewed following labs and imaging studies  CBC: Recent Labs  Lab 08/08/18 2035  WBC 8.8  HGB 13.6  HCT 40.6  MCV 83.2  PLT 334   Basic Metabolic Panel: Recent Labs  Lab 08/08/18 2035  NA 142  K 3.5  CL 102  CO2 26  GLUCOSE 92  BUN 15  CREATININE 0.98  CALCIUM 9.3  MG 2.1   GFR: Estimated Creatinine Clearance: 43.3 mL/min (by C-G formula based on SCr of 0.98 mg/dL). Liver Function Tests: Recent Labs  Lab  08/08/18 2035  AST 23  ALT 17  ALKPHOS 71  BILITOT 0.7  PROT 7.6  ALBUMIN 3.8   Recent Labs  Lab 08/08/18 2035  LIPASE 26   No results for input(s): AMMONIA in the last 168 hours. Coagulation Profile: No results for input(s): INR, PROTIME in the last 168 hours. Cardiac Enzymes: No results for input(s): CKTOTAL, CKMB, CKMBINDEX, TROPONINI in the last 168 hours. BNP (last 3 results) No results for input(s): PROBNP in the last 8760 hours. HbA1C: No results for input(s): HGBA1C in the last 72 hours. CBG: Recent Labs  Lab 08/08/18 2207  GLUCAP 88   Lipid Profile: No results for input(s): CHOL, HDL, LDLCALC, TRIG, CHOLHDL, LDLDIRECT in the last 72 hours. Thyroid Function Tests: No results for input(s): TSH, T4TOTAL, FREET4, T3FREE, THYROIDAB in the last 72 hours. Anemia Panel: No results for input(s): VITAMINB12, FOLATE, FERRITIN, TIBC, IRON, RETICCTPCT in the last 72 hours. Urine analysis:    Component Value Date/Time   COLORURINE YELLOW 08/08/2018 2035   APPEARANCEUR CLEAR 08/08/2018 2035   LABSPEC 1.010 08/08/2018 2035   PHURINE 5.0 08/08/2018 2035   GLUCOSEU NEGATIVE 08/08/2018 2035   HGBUR NEGATIVE 08/08/2018 2035   BILIRUBINUR NEGATIVE 08/08/2018 2035   KETONESUR NEGATIVE 08/08/2018 2035   PROTEINUR NEGATIVE 08/08/2018 2035   NITRITE NEGATIVE 08/08/2018 2035   LEUKOCYTESUR TRACE (A) 08/08/2018 2035   Sepsis Labs: @LABRCNTIP (procalcitonin:4,lacticidven:4) )No results found for this or any previous visit (from the past 240 hour(s)).   Radiological Exams on Admission: Dg Chest 2 View  Result Date: 08/08/2018 CLINICAL DATA:  Syncopal episode today. Chest pain. Low blood pressure. Swelling in the legs. Former smoker. EXAM: CHEST - 2 VIEW COMPARISON:  04/05/2017 FINDINGS: The heart size and mediastinal contours are within normal limits. Both lungs are clear. The visualized skeletal structures are unremarkable. IMPRESSION: No active cardiopulmonary disease.  Electronically Signed   By: Burman Nieves M.D.   On: 08/08/2018 22:01    EKG: Independently reviewed.  Shows normal sinus  rhythm with a rate of 72, normal intervals, no significant ST changes  Assessment/Plan Principal Problem:   Syncope Active Problems:   HTN (hypertension)   Edema extremities     #1 syncope: Patient has risk factors for possible cardiac causes including hypertension and hyperlipidemia with lower extremity edema which has not been worked up fully.  She could also have had vasovagal syncope based on the history and presentation.  We will admit the patient for observation.  Get 2D echocardiogram.  Check orthostatics on gentle hydration despite edema.  Get PT to evaluate patient.  If echo is negative she may be discharged home with vasovagal syncope as diagnosis.  Follow-up with Dr. Jacinto Halim her cardiologist will be recommended.  #2 hypertension: Continue blood pressure medications from home.  Patient apparently has been compliant.  #3 bilateral lower extremity edema: Chronic stasis edema suspected.  Patient has no shortness of breath and chest x-ray is clear with no other evidence of fluid overload.  We will nevertheless get an echocardiogram.  She has been taking torsemide for her lower extremity edema will recommend that.  Elevate the feet.  #4 obesity: Counseling will be provided.  #5 hyperlipidemia: Patient on a statin and will continue with that.  #6 GERD: Continue with PPIs   DVT prophylaxis: Lovenox  Code Status: Full code  Family Communication: Husband and sister at bedside  Disposition Plan: Home when ready  Consults called: None  Admission status: Observation  Severity of Illness: The appropriate patient status for this patient is OBSERVATION. Observation status is judged to be reasonable and necessary in order to provide the required intensity of service to ensure the patient's safety. The patient's presenting symptoms, physical exam findings, and initial  radiographic and laboratory data in the context of their medical condition is felt to place them at decreased risk for further clinical deterioration. Furthermore, it is anticipated that the patient will be medically stable for discharge from the hospital within 2 midnights of admission. The following factors support the patient status of observation.   " The patient's presenting symptoms include passing out. " The physical exam findings include mild orthostasis with bradycardia and hypotension. " The initial radiographic and laboratory data are normal chest x-ray and blood work.     Lonia Blood MD [facility] Pager [phone]  If 7PM-7AM, please contact night-coverage [email or web address] Password [facility]  08/09/2018, 12:18 AM

## 2018-08-09 NOTE — Discharge Summary (Signed)
Physician Discharge Summary  Sheila Logan [name] DOB: 1941-02-13 DOA: 08/08/2018  PCP: Andi DevonShelton, Kimberly, MD  Admit date: 08/08/2018 Discharge date: 08/09/2018  Admitted From: Home Disposition:  Home  Recommendations for Outpatient Follow-up:  1. Follow up with Dr. Jacinto HalimGanji. His office will arrange follow up for 30 day event monitor   Discharge Condition: Stable CODE STATUS: Full  Diet recommendation: Heart healthy   Brief/Interim Summary: Sheila Logan is a 77 y.o. female with medical history significant of hypertension, chronic lower extremity edema, GERD, hyperlipidemia as well as previous syncope about 2 years ago who recently saw a cardiologist in March for her lower extremity edema.  Patient says she has some tests done but is not aware of the results yet.  She was at a birthday party tonight sitting be to multiple people when she apparently passed out.  She had no recollection of what happened.  Patient was out for a few minutes.  She was brought to the ER where she is now fully awake alert and back to baseline.  During the episodes her blood pressure was apparently low as well as her heart rate.  EMS of giving her fluids.  She is also read fluids in the ER.  Blood pressure has rebounded and pulse is in the 60s now.  It was in the 40s at the time.  Patient denied any prodromal symptoms.  She has been feeling weird in the last 1 week she said.  No specific symptoms.  Patient has taken some blood pressure medications including some diuretics due to worsening leg edema.  She has chronically had leg edema and has been managing it on and off.  She also has a past history of vertigo and possible dementia.  She takes meclizine on and off.  Patient is on Aricept which she has continued to take.  In the ER she is back to baseline at the moment but she is being admitted for work-up of syncopal episode.  ED Course: Patient's blood pressure on arrival is 105/69 with a pulse rate of  59, slightly orthostatic, other vitals are stable, lab work is completely normal except, initial troponin and EKG were all within normal.  Patient suspected to have had syncope which could be vasovagal.  She has been admitted for treatment and observation with full work-up.  Chest x-ray is also negative.  Interim: Orthostatic vital signs were obtained which were negative.  Patient remained stable with normal sinus rhythm, no further episodes.  On day of discharge, she had no complaints of dizziness, lightheadedness, chest pain, shortness of breath, nausea, vomiting, diarrhea or abdominal pain.  Case was discussed with Dr. Jacinto HalimGanji with cardiology, he does not feel that patient's episode was cardiogenic in nature, but more so vasovagal in etiology.  He will plan for 30-day event monitor and follow-up as an outpatient.  Echocardiogram was completed prior to discharge, revealed grade 1 diastolic dysfunction, EF 60%, largely unchanged since previous echocardiogram in 2018.  Discharge Diagnoses:  Principal Problem:   Syncope Active Problems:   HTN (hypertension)   Edema extremities   Chronic diastolic CHF (congestive heart failure) (HCC)   HLD (hyperlipidemia)   Discharge Instructions  Discharge Instructions    (HEART FAILURE PATIENTS) Call MD:  Anytime you have any of the following symptoms: 1) 3 pound weight gain in 24 hours or 5 pounds in 1 week 2) shortness of breath, with or without a dry hacking cough 3) swelling in the hands, feet or stomach 4) if you  have to sleep on extra pillows at night in order to breathe.   Complete by:  As directed    Call MD for:  difficulty breathing, headache or visual disturbances   Complete by:  As directed    Call MD for:  extreme fatigue   Complete by:  As directed    Call MD for:  hives   Complete by:  As directed    Call MD for:  persistant dizziness or light-headedness   Complete by:  As directed    Call MD for:  persistant nausea and vomiting   Complete  by:  As directed    Call MD for:  severe uncontrolled pain   Complete by:  As directed    Call MD for:  temperature >100.4   Complete by:  As directed    Diet - low sodium heart healthy   Complete by:  As directed    Discharge instructions   Complete by:  As directed    You were cared for by a hospitalist during your hospital stay. If you have any questions about your discharge medications or the care you received while you were in the hospital after you are discharged, you can call the unit and ask to speak with the hospitalist on call if the hospitalist that took care of you is not available. Once you are discharged, your primary care physician will handle any further medical issues. Please note that NO REFILLS for any discharge medications will be authorized once you are discharged, as it is imperative that you return to your primary care physician (or establish a relationship with a primary care physician if you do not have one) for your aftercare needs so that they can reassess your need for medications and monitor your lab values.   Increase activity slowly   Complete by:  As directed      Allergies as of 08/09/2018   No Known Allergies     Medication List    STOP taking these medications   acetaminophen 325 MG tablet Commonly known as:  TYLENOL   ibuprofen 600 MG tablet Commonly known as:  ADVIL,MOTRIN   meloxicam 15 MG tablet Commonly known as:  MOBIC   naproxen sodium 220 MG tablet Commonly known as:  ALEVE     TAKE these medications   artificial tears ointment Place 1 drop into both eyes as needed (dry eyes).   aspirin 81 MG tablet Take 81 mg by mouth daily.   B-complex with vitamin C tablet Take 1 tablet by mouth daily.   cholecalciferol 1000 units tablet Commonly known as:  VITAMIN D Take 1,000 Units by mouth daily.   donepezil 10 MG tablet Commonly known as:  ARICEPT Take 10 mg by mouth at bedtime.   Flaxseed Oil 1000 MG Caps Take 1 capsule by mouth  daily.   losartan-hydrochlorothiazide 50-12.5 MG tablet Commonly known as:  HYZAAR Take 1 tablet by mouth daily.   magnesium oxide 400 MG tablet Commonly known as:  MAG-OX Take 400 mg by mouth daily.   meclizine 25 MG tablet Commonly known as:  ANTIVERT Take 25 mg by mouth 3 (three) times daily as needed for dizziness.   MULTIVITAMIN ADULTS Tabs Take 1 tablet by mouth daily.   N-Acetyl-L-Cysteine 600 MG Caps Take 1 capsule by mouth daily.   olopatadine 0.1 % ophthalmic solution Commonly known as:  PATANOL Place 1 drop into both eyes daily.   omega-3 acid ethyl esters 1 g capsule Commonly known as:  LOVAZA Take 1 g by mouth daily.   omeprazole 20 MG capsule Commonly known as:  PRILOSEC Take 1 capsule (20 mg total) by mouth daily. 30 minutes before breakfast   potassium chloride SA 20 MEQ tablet Commonly known as:  K-DUR,KLOR-CON Take 20 mEq by mouth daily.   simvastatin 20 MG tablet Commonly known as:  ZOCOR Take 20 mg by mouth daily.   torsemide 20 MG tablet Commonly known as:  DEMADEX Take 40 mg by mouth daily as needed (swelling).   vitamin B-12 100 MCG tablet Commonly known as:  CYANOCOBALAMIN Take 100 mcg by mouth daily.   vitamin C 250 MG tablet Commonly known as:  ASCORBIC ACID Take 250 mg by mouth daily.      Follow-up Information    Yates Decamp, MD Follow up.   Specialty:  Cardiology Why:  His office will arrange for 30 day event monitor Contact information: [address] [place] [address] [phone]          No Known Allergies  Consultations:  None   Procedures/Studies: Dg Chest 2 View  Result Date: 08/08/2018 CLINICAL DATA:  Syncopal episode today. Chest pain. Low blood pressure. Swelling in the legs. Former smoker. EXAM: CHEST - 2 VIEW COMPARISON:  04/05/2017 FINDINGS: The heart size and mediastinal contours are within normal limits. Both lungs are clear. The visualized skeletal structures are unremarkable.  IMPRESSION: No active cardiopulmonary disease. Electronically Signed   By: Burman Nieves M.D.   On: 08/08/2018 22:01   EchoStudy Conclusions  - Left ventricle: The cavity size was normal. Wall thickness was   increased in a pattern of moderate LVH. Systolic function was   vigorous. The estimated ejection fraction was in the range of 65%   to 70%. Wall motion was normal; there were no regional wall   motion abnormalities. Doppler parameters are consistent with   abnormal left ventricular relaxation (grade 1 diastolic   dysfunction). Indeterminate filling pressures. - Mitral valve: Mildly calcified annulus.      Discharge Exam: Vitals:   08/09/18 0115 08/09/18 0608  BP: (!) 168/73 (!) 107/59  Pulse: (!) 55 63  Resp: 16 16  Temp: (!) 97.5 F (36.4 C) 97.9 F (36.6 C)  SpO2: 99% 98%    General: Pt is alert, awake, not in acute distress Cardiovascular: RRR, S1/S2 +, no rubs, no gallops Respiratory: CTA bilaterally, no wheezing, no rhonchi Abdominal: Soft, NT, ND, bowel sounds + Extremities: no edema, no cyanosis    The results of significant diagnostics from this hospitalization (including imaging, microbiology, ancillary and laboratory) are listed below for reference.     Microbiology: No results found for this or any previous visit (from the past 240 hour(s)).   Labs: BNP (last 3 results) Recent Labs    08/08/18 2035  BNP 17.5   Basic Metabolic Panel: Recent Labs  Lab 08/08/18 2035 08/09/18 0350  NA 142 141  K 3.5 3.0*  CL 102 105  CO2 26 27  GLUCOSE 92 92  BUN 15 15  CREATININE 0.98 0.81  CALCIUM 9.3 8.7*  MG 2.1  --    Liver Function Tests: Recent Labs  Lab 08/08/18 2035 08/09/18 0350  AST 23 16  ALT 17 14  ALKPHOS 71 60  BILITOT 0.7 0.9  PROT 7.6 6.5  ALBUMIN 3.8 3.4*   Recent Labs  Lab 08/08/18 2035  LIPASE 26   No results for input(s): AMMONIA in the last 168 hours. CBC: Recent Labs  Lab 08/08/18 2035 08/09/18 0350  WBC 8.8  9.2  HGB 13.6 12.6  HCT 40.6 37.7  MCV 83.2 83.0  PLT 334 300   Cardiac Enzymes: No results for input(s): CKTOTAL, CKMB, CKMBINDEX, TROPONINI in the last 168 hours. BNP: Invalid input(s): POCBNP CBG: Recent Labs  Lab 08/08/18 2207 08/09/18 0752 08/09/18 1135  GLUCAP 88 80 88   D-Dimer No results for input(s): DDIMER in the last 72 hours. Hgb A1c No results for input(s): HGBA1C in the last 72 hours. Lipid Profile No results for input(s): CHOL, HDL, LDLCALC, TRIG, CHOLHDL, LDLDIRECT in the last 72 hours. Thyroid function studies No results for input(s): TSH, T4TOTAL, T3FREE, THYROIDAB in the last 72 hours.  Invalid input(s): FREET3 Anemia work up No results for input(s): VITAMINB12, FOLATE, FERRITIN, TIBC, IRON, RETICCTPCT in the last 72 hours. Urinalysis    Component Value Date/Time   COLORURINE YELLOW 08/08/2018 2035   APPEARANCEUR CLEAR 08/08/2018 2035   LABSPEC 1.010 08/08/2018 2035   PHURINE 5.0 08/08/2018 2035   GLUCOSEU NEGATIVE 08/08/2018 2035   HGBUR NEGATIVE 08/08/2018 2035   BILIRUBINUR NEGATIVE 08/08/2018 2035   KETONESUR NEGATIVE 08/08/2018 2035   PROTEINUR NEGATIVE 08/08/2018 2035   NITRITE NEGATIVE 08/08/2018 2035   LEUKOCYTESUR TRACE (A) 08/08/2018 2035   Sepsis Labs Invalid input(s): PROCALCITONIN,  WBC,  LACTICIDVEN Microbiology No results found for this or any previous visit (from the past 240 hour(s)).   Patient was seen and examined on the day of discharge and was found to be in stable condition. Time coordinating discharge: 35 minutes including assessment and coordination of care, as well as examination of the patient.   SIGNED:  Noralee Stain, DO [facility] Pager [phone]  If 7PM-7AM, please contact night-coverage [email or web address] Password [facility] 08/09/2018, 1:43 PM

## 2018-08-10 LAB — URINE CULTURE

## 2018-08-17 ENCOUNTER — Other Ambulatory Visit: Payer: Medicare Other

## 2018-08-19 ENCOUNTER — Ambulatory Visit
Admission: RE | Admit: 2018-08-19 | Discharge: 2018-08-19 | Disposition: A | Discharge status: Home / Self Care | Payer: Medicare Other | Source: Ambulatory Visit | Attending: Internal Medicine | Admitting: Internal Medicine

## 2018-08-19 DIAGNOSIS — M5416 Radiculopathy, lumbar region: Secondary | ICD-10-CM

## 2018-12-01 ENCOUNTER — Other Ambulatory Visit: Payer: Self-pay | Admitting: Gastroenterology

## 2018-12-01 DIAGNOSIS — R1032 Left lower quadrant pain: Secondary | ICD-10-CM

## 2018-12-10 ENCOUNTER — Ambulatory Visit
Admission: RE | Admit: 2018-12-10 | Discharge: 2018-12-10 | Disposition: A | Discharge status: Home / Self Care | Payer: Medicare Other | Source: Ambulatory Visit | Attending: Gastroenterology | Admitting: Gastroenterology

## 2018-12-10 DIAGNOSIS — R1032 Left lower quadrant pain: Secondary | ICD-10-CM

## 2018-12-10 MED ORDER — IOPAMIDOL (ISOVUE-300) INJECTION 61%
100.0000 mL | Freq: Once | INTRAVENOUS | Status: AC | PRN
  Administered 2018-12-10: 100 mL via INTRAVENOUS

## 2019-03-12 ENCOUNTER — Other Ambulatory Visit: Payer: Self-pay | Admitting: Internal Medicine

## 2019-03-12 ENCOUNTER — Other Ambulatory Visit: Payer: Self-pay

## 2019-03-12 ENCOUNTER — Ambulatory Visit
Admission: RE | Admit: 2019-03-12 | Discharge: 2019-03-12 | Disposition: A | Discharge status: Home / Self Care | Payer: Medicare Other | Source: Ambulatory Visit | Attending: Internal Medicine | Admitting: Internal Medicine

## 2019-03-12 DIAGNOSIS — R059 Cough, unspecified: Secondary | ICD-10-CM

## 2019-03-12 DIAGNOSIS — R05 Cough: Secondary | ICD-10-CM

## 2019-05-12 ENCOUNTER — Other Ambulatory Visit: Payer: Self-pay

## 2019-05-12 ENCOUNTER — Other Ambulatory Visit: Payer: Self-pay | Admitting: Internal Medicine

## 2019-05-12 ENCOUNTER — Ambulatory Visit
Admission: RE | Admit: 2019-05-12 | Discharge: 2019-05-12 | Disposition: A | Discharge status: Home / Self Care | Payer: Medicare Other | Source: Ambulatory Visit | Attending: Internal Medicine | Admitting: Internal Medicine

## 2019-05-12 DIAGNOSIS — M545 Low back pain, unspecified: Secondary | ICD-10-CM

## 2019-05-12 DIAGNOSIS — M533 Sacrococcygeal disorders, not elsewhere classified: Secondary | ICD-10-CM

## 2019-05-20 ENCOUNTER — Other Ambulatory Visit: Payer: Self-pay | Admitting: Internal Medicine

## 2019-05-20 DIAGNOSIS — Z1231 Encounter for screening mammogram for malignant neoplasm of breast: Secondary | ICD-10-CM

## 2019-07-19 ENCOUNTER — Other Ambulatory Visit: Payer: Self-pay

## 2019-07-19 ENCOUNTER — Ambulatory Visit
Admission: RE | Admit: 2019-07-19 | Discharge: 2019-07-19 | Disposition: A | Discharge status: Home / Self Care | Payer: Medicare Other | Source: Ambulatory Visit | Attending: Internal Medicine | Admitting: Internal Medicine

## 2019-07-19 DIAGNOSIS — Z1231 Encounter for screening mammogram for malignant neoplasm of breast: Secondary | ICD-10-CM

## 2019-10-05 IMAGING — CR DG CHEST 2V
2 series · 2 of 2 positions shown · non-contrast
Comparison: 04/05/2017

CLINICAL DATA: Syncopal episode today. Chest pain. Low blood
pressure. Swelling in the legs. Former smoker.

EXAM:
CHEST - 2 VIEW

[w chest pa]
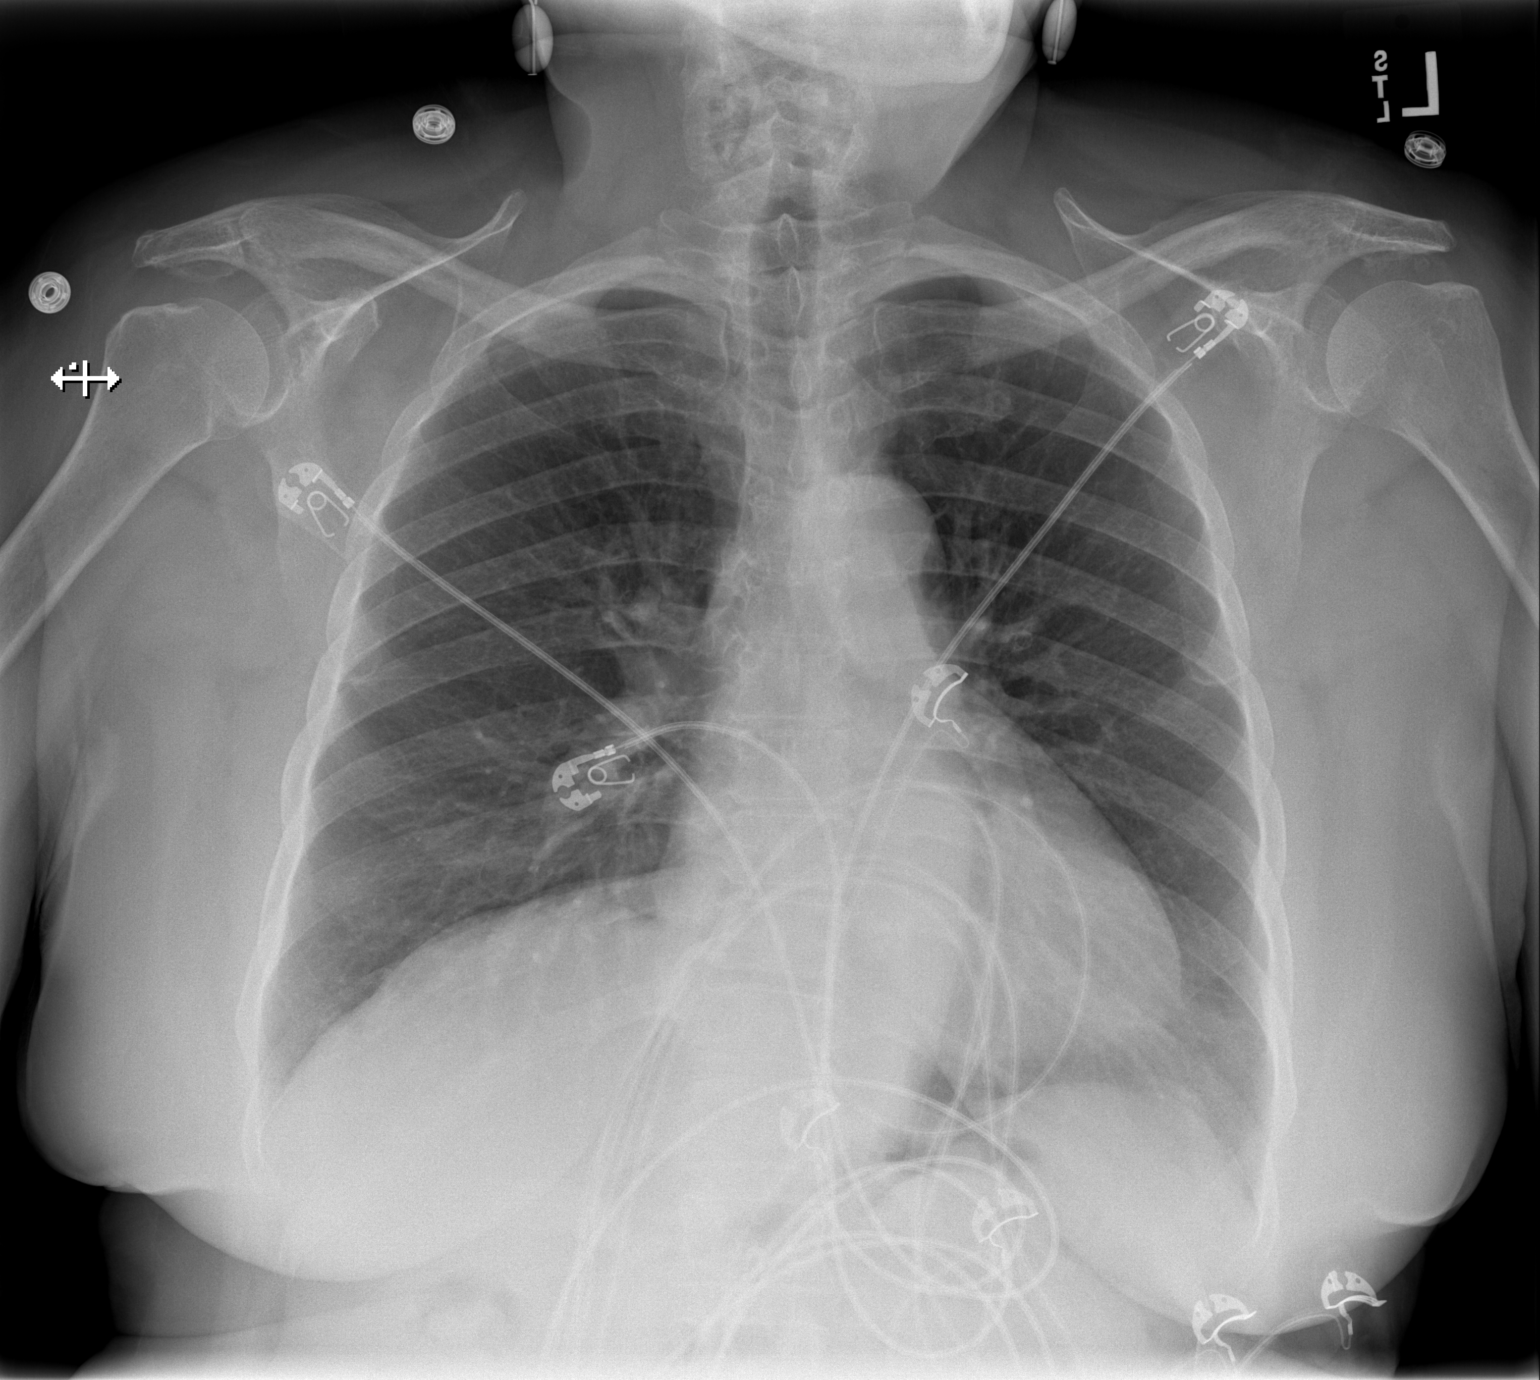

[w chest lat]
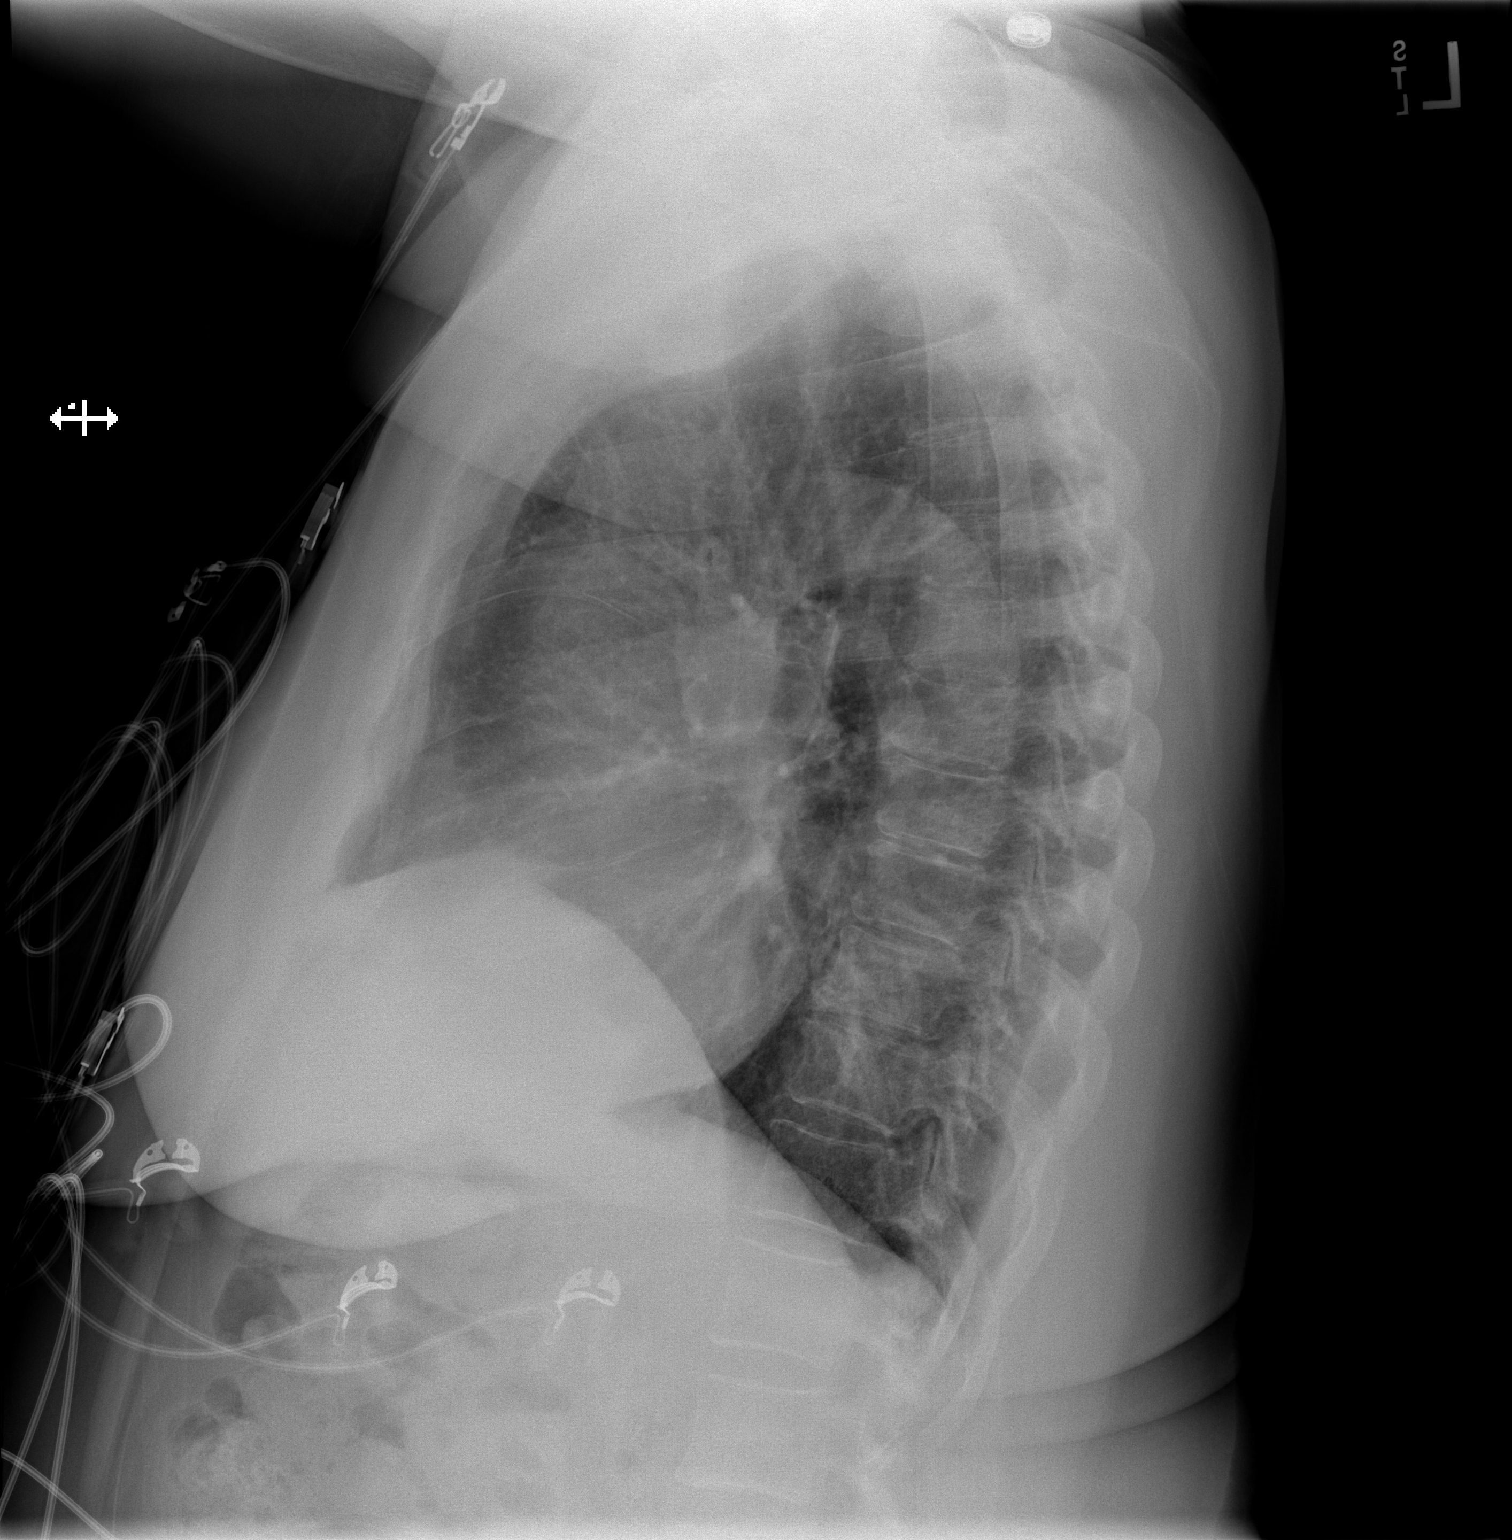

[2 of 2 positions shown; findings below may reference images not displayed]

FINDINGS: The heart size and mediastinal contours are within normal limits.
Both lungs are clear. The visualized skeletal structures are
unremarkable.
IMPRESSION: No active cardiopulmonary disease.

## 2020-07-19 ENCOUNTER — Other Ambulatory Visit: Payer: Self-pay | Admitting: Internal Medicine

## 2020-07-19 DIAGNOSIS — E2839 Other primary ovarian failure: Secondary | ICD-10-CM

## 2020-10-10 ENCOUNTER — Ambulatory Visit
Admission: RE | Admit: 2020-10-10 | Discharge: 2020-10-10 | Disposition: A | Discharge status: Home / Self Care | Payer: Medicare PPO | Source: Ambulatory Visit | Attending: Internal Medicine | Admitting: Internal Medicine

## 2020-10-10 ENCOUNTER — Other Ambulatory Visit: Payer: Self-pay

## 2020-10-10 DIAGNOSIS — E2839 Other primary ovarian failure: Secondary | ICD-10-CM

## 2020-10-10 DIAGNOSIS — M85851 Other specified disorders of bone density and structure, right thigh: Secondary | ICD-10-CM | POA: Diagnosis not present

## 2020-10-10 DIAGNOSIS — Z780 Asymptomatic menopausal state: Secondary | ICD-10-CM | POA: Diagnosis not present

## 2020-10-17 DIAGNOSIS — G8929 Other chronic pain: Secondary | ICD-10-CM | POA: Diagnosis not present

## 2020-10-17 DIAGNOSIS — M1990 Unspecified osteoarthritis, unspecified site: Secondary | ICD-10-CM | POA: Diagnosis not present

## 2020-10-17 DIAGNOSIS — Z23 Encounter for immunization: Secondary | ICD-10-CM | POA: Diagnosis not present

## 2020-10-17 DIAGNOSIS — R1084 Generalized abdominal pain: Secondary | ICD-10-CM | POA: Diagnosis not present

## 2020-10-17 DIAGNOSIS — M791 Myalgia, unspecified site: Secondary | ICD-10-CM | POA: Diagnosis not present

## 2020-11-15 DIAGNOSIS — R262 Difficulty in walking, not elsewhere classified: Secondary | ICD-10-CM | POA: Diagnosis not present

## 2020-11-15 DIAGNOSIS — M5459 Other low back pain: Secondary | ICD-10-CM | POA: Diagnosis not present

## 2020-11-15 DIAGNOSIS — M25561 Pain in right knee: Secondary | ICD-10-CM | POA: Diagnosis not present

## 2020-11-15 DIAGNOSIS — M25562 Pain in left knee: Secondary | ICD-10-CM | POA: Diagnosis not present

## 2020-11-21 DIAGNOSIS — M25562 Pain in left knee: Secondary | ICD-10-CM | POA: Diagnosis not present

## 2020-11-21 DIAGNOSIS — M5459 Other low back pain: Secondary | ICD-10-CM | POA: Diagnosis not present

## 2020-11-21 DIAGNOSIS — R262 Difficulty in walking, not elsewhere classified: Secondary | ICD-10-CM | POA: Diagnosis not present

## 2020-11-21 DIAGNOSIS — M25561 Pain in right knee: Secondary | ICD-10-CM | POA: Diagnosis not present

## 2020-11-24 DIAGNOSIS — I10 Essential (primary) hypertension: Secondary | ICD-10-CM | POA: Diagnosis not present

## 2020-11-24 DIAGNOSIS — Z23 Encounter for immunization: Secondary | ICD-10-CM | POA: Diagnosis not present

## 2020-11-24 DIAGNOSIS — F3341 Major depressive disorder, recurrent, in partial remission: Secondary | ICD-10-CM | POA: Diagnosis not present

## 2020-11-24 DIAGNOSIS — R635 Abnormal weight gain: Secondary | ICD-10-CM | POA: Diagnosis not present

## 2020-11-30 DIAGNOSIS — M5459 Other low back pain: Secondary | ICD-10-CM | POA: Diagnosis not present

## 2020-11-30 DIAGNOSIS — M25562 Pain in left knee: Secondary | ICD-10-CM | POA: Diagnosis not present

## 2020-11-30 DIAGNOSIS — R262 Difficulty in walking, not elsewhere classified: Secondary | ICD-10-CM | POA: Diagnosis not present

## 2020-11-30 DIAGNOSIS — M25561 Pain in right knee: Secondary | ICD-10-CM | POA: Diagnosis not present

## 2020-12-08 DIAGNOSIS — M25562 Pain in left knee: Secondary | ICD-10-CM | POA: Diagnosis not present

## 2020-12-08 DIAGNOSIS — R262 Difficulty in walking, not elsewhere classified: Secondary | ICD-10-CM | POA: Diagnosis not present

## 2020-12-08 DIAGNOSIS — M5459 Other low back pain: Secondary | ICD-10-CM | POA: Diagnosis not present

## 2020-12-08 DIAGNOSIS — M25561 Pain in right knee: Secondary | ICD-10-CM | POA: Diagnosis not present

## 2021-04-11 DIAGNOSIS — I10 Essential (primary) hypertension: Secondary | ICD-10-CM | POA: Diagnosis not present

## 2021-04-11 DIAGNOSIS — F0390 Unspecified dementia without behavioral disturbance: Secondary | ICD-10-CM | POA: Diagnosis not present

## 2021-04-11 DIAGNOSIS — F3341 Major depressive disorder, recurrent, in partial remission: Secondary | ICD-10-CM | POA: Diagnosis not present

## 2021-04-11 DIAGNOSIS — R350 Frequency of micturition: Secondary | ICD-10-CM | POA: Diagnosis not present

## 2021-04-11 DIAGNOSIS — E785 Hyperlipidemia, unspecified: Secondary | ICD-10-CM | POA: Diagnosis not present

## 2021-04-22 DIAGNOSIS — Z1152 Encounter for screening for COVID-19: Secondary | ICD-10-CM | POA: Diagnosis not present

## 2021-04-30 DIAGNOSIS — Z1152 Encounter for screening for COVID-19: Secondary | ICD-10-CM | POA: Diagnosis not present

## 2021-05-08 DIAGNOSIS — Z1152 Encounter for screening for COVID-19: Secondary | ICD-10-CM | POA: Diagnosis not present

## 2021-05-14 DIAGNOSIS — Z1152 Encounter for screening for COVID-19: Secondary | ICD-10-CM | POA: Diagnosis not present

## 2021-05-25 DIAGNOSIS — Z1152 Encounter for screening for COVID-19: Secondary | ICD-10-CM | POA: Diagnosis not present

## 2021-05-29 DIAGNOSIS — Z1152 Encounter for screening for COVID-19: Secondary | ICD-10-CM | POA: Diagnosis not present

## 2021-06-04 DIAGNOSIS — Z1152 Encounter for screening for COVID-19: Secondary | ICD-10-CM | POA: Diagnosis not present

## 2021-06-11 DIAGNOSIS — Z1152 Encounter for screening for COVID-19: Secondary | ICD-10-CM | POA: Diagnosis not present

## 2021-06-27 DIAGNOSIS — Z1152 Encounter for screening for COVID-19: Secondary | ICD-10-CM | POA: Diagnosis not present

## 2021-07-03 DIAGNOSIS — Z1152 Encounter for screening for COVID-19: Secondary | ICD-10-CM | POA: Diagnosis not present

## 2021-07-09 DIAGNOSIS — Z1152 Encounter for screening for COVID-19: Secondary | ICD-10-CM | POA: Diagnosis not present

## 2021-07-11 DIAGNOSIS — N3941 Urge incontinence: Secondary | ICD-10-CM | POA: Diagnosis not present

## 2021-07-11 DIAGNOSIS — Z7189 Other specified counseling: Secondary | ICD-10-CM | POA: Diagnosis not present

## 2021-07-11 DIAGNOSIS — I10 Essential (primary) hypertension: Secondary | ICD-10-CM | POA: Diagnosis not present

## 2021-07-11 DIAGNOSIS — M179 Osteoarthritis of knee, unspecified: Secondary | ICD-10-CM | POA: Diagnosis not present

## 2021-07-11 DIAGNOSIS — Z0000 Encounter for general adult medical examination without abnormal findings: Secondary | ICD-10-CM | POA: Diagnosis not present

## 2021-07-11 DIAGNOSIS — F3341 Major depressive disorder, recurrent, in partial remission: Secondary | ICD-10-CM | POA: Diagnosis not present

## 2021-07-11 DIAGNOSIS — Z23 Encounter for immunization: Secondary | ICD-10-CM | POA: Diagnosis not present

## 2021-07-11 DIAGNOSIS — E785 Hyperlipidemia, unspecified: Secondary | ICD-10-CM | POA: Diagnosis not present

## 2021-07-16 DIAGNOSIS — Z1152 Encounter for screening for COVID-19: Secondary | ICD-10-CM | POA: Diagnosis not present

## 2021-07-20 DIAGNOSIS — Z1152 Encounter for screening for COVID-19: Secondary | ICD-10-CM | POA: Diagnosis not present

## 2021-07-30 DIAGNOSIS — Z1152 Encounter for screening for COVID-19: Secondary | ICD-10-CM | POA: Diagnosis not present

## 2021-08-06 DIAGNOSIS — Z1152 Encounter for screening for COVID-19: Secondary | ICD-10-CM | POA: Diagnosis not present

## 2021-08-11 DIAGNOSIS — Z1152 Encounter for screening for COVID-19: Secondary | ICD-10-CM | POA: Diagnosis not present

## 2021-08-20 DIAGNOSIS — Z1152 Encounter for screening for COVID-19: Secondary | ICD-10-CM | POA: Diagnosis not present

## 2021-08-29 DIAGNOSIS — Z1152 Encounter for screening for COVID-19: Secondary | ICD-10-CM | POA: Diagnosis not present

## 2021-09-03 DIAGNOSIS — Z1152 Encounter for screening for COVID-19: Secondary | ICD-10-CM | POA: Diagnosis not present

## 2021-09-09 DIAGNOSIS — Z1152 Encounter for screening for COVID-19: Secondary | ICD-10-CM | POA: Diagnosis not present

## 2021-09-25 DIAGNOSIS — Z1152 Encounter for screening for COVID-19: Secondary | ICD-10-CM | POA: Diagnosis not present

## 2021-09-25 DIAGNOSIS — F0390 Unspecified dementia without behavioral disturbance: Secondary | ICD-10-CM | POA: Diagnosis not present

## 2021-09-25 DIAGNOSIS — F03918 Unspecified dementia, unspecified severity, with other behavioral disturbance: Secondary | ICD-10-CM | POA: Diagnosis not present

## 2021-09-25 DIAGNOSIS — Z6834 Body mass index (BMI) 34.0-34.9, adult: Secondary | ICD-10-CM | POA: Diagnosis not present

## 2021-09-25 DIAGNOSIS — Z87891 Personal history of nicotine dependence: Secondary | ICD-10-CM | POA: Diagnosis not present

## 2021-09-25 DIAGNOSIS — R2681 Unsteadiness on feet: Secondary | ICD-10-CM | POA: Diagnosis not present

## 2021-09-25 DIAGNOSIS — I10 Essential (primary) hypertension: Secondary | ICD-10-CM | POA: Diagnosis not present

## 2021-09-25 DIAGNOSIS — E785 Hyperlipidemia, unspecified: Secondary | ICD-10-CM | POA: Diagnosis not present

## 2021-09-25 DIAGNOSIS — Z23 Encounter for immunization: Secondary | ICD-10-CM | POA: Diagnosis not present

## 2021-09-25 DIAGNOSIS — M170 Bilateral primary osteoarthritis of knee: Secondary | ICD-10-CM | POA: Diagnosis not present

## 2021-09-25 DIAGNOSIS — R32 Unspecified urinary incontinence: Secondary | ICD-10-CM | POA: Diagnosis not present

## 2021-09-25 DIAGNOSIS — M1990 Unspecified osteoarthritis, unspecified site: Secondary | ICD-10-CM | POA: Diagnosis not present

## 2021-09-25 DIAGNOSIS — E669 Obesity, unspecified: Secondary | ICD-10-CM | POA: Diagnosis not present

## 2021-09-25 DIAGNOSIS — M8580 Other specified disorders of bone density and structure, unspecified site: Secondary | ICD-10-CM | POA: Diagnosis not present

## 2021-09-26 DIAGNOSIS — R32 Unspecified urinary incontinence: Secondary | ICD-10-CM | POA: Diagnosis not present

## 2021-10-02 DIAGNOSIS — Z1152 Encounter for screening for COVID-19: Secondary | ICD-10-CM | POA: Diagnosis not present

## 2021-10-09 ENCOUNTER — Other Ambulatory Visit: Payer: Self-pay | Admitting: Internal Medicine

## 2021-10-09 DIAGNOSIS — R4182 Altered mental status, unspecified: Secondary | ICD-10-CM | POA: Diagnosis not present

## 2021-10-09 DIAGNOSIS — R112 Nausea with vomiting, unspecified: Secondary | ICD-10-CM | POA: Diagnosis not present

## 2021-10-09 DIAGNOSIS — F0390 Unspecified dementia without behavioral disturbance: Secondary | ICD-10-CM | POA: Diagnosis not present

## 2021-10-09 DIAGNOSIS — W19XXXD Unspecified fall, subsequent encounter: Secondary | ICD-10-CM | POA: Diagnosis not present

## 2021-10-09 DIAGNOSIS — R2681 Unsteadiness on feet: Secondary | ICD-10-CM | POA: Diagnosis not present

## 2021-10-10 ENCOUNTER — Ambulatory Visit
Admission: RE | Admit: 2021-10-10 | Discharge: 2021-10-10 | Disposition: A | Discharge status: Home / Self Care | Payer: Medicare PPO | Source: Ambulatory Visit | Attending: Internal Medicine | Admitting: Internal Medicine

## 2021-10-10 ENCOUNTER — Other Ambulatory Visit: Payer: Self-pay

## 2021-10-10 DIAGNOSIS — G319 Degenerative disease of nervous system, unspecified: Secondary | ICD-10-CM | POA: Diagnosis not present

## 2021-10-10 DIAGNOSIS — S0990XA Unspecified injury of head, initial encounter: Secondary | ICD-10-CM | POA: Diagnosis not present

## 2021-10-10 DIAGNOSIS — R112 Nausea with vomiting, unspecified: Secondary | ICD-10-CM

## 2021-11-07 DIAGNOSIS — F3341 Major depressive disorder, recurrent, in partial remission: Secondary | ICD-10-CM | POA: Diagnosis not present

## 2021-11-07 DIAGNOSIS — G8929 Other chronic pain: Secondary | ICD-10-CM | POA: Diagnosis not present

## 2021-11-07 DIAGNOSIS — Z1152 Encounter for screening for COVID-19: Secondary | ICD-10-CM | POA: Diagnosis not present

## 2021-11-07 DIAGNOSIS — M1990 Unspecified osteoarthritis, unspecified site: Secondary | ICD-10-CM | POA: Diagnosis not present

## 2021-11-07 DIAGNOSIS — I10 Essential (primary) hypertension: Secondary | ICD-10-CM | POA: Diagnosis not present

## 2021-11-07 DIAGNOSIS — E785 Hyperlipidemia, unspecified: Secondary | ICD-10-CM | POA: Diagnosis not present

## 2021-11-07 DIAGNOSIS — M179 Osteoarthritis of knee, unspecified: Secondary | ICD-10-CM | POA: Diagnosis not present

## 2021-11-07 DIAGNOSIS — F0390 Unspecified dementia without behavioral disturbance: Secondary | ICD-10-CM | POA: Diagnosis not present

## 2021-11-07 DIAGNOSIS — M170 Bilateral primary osteoarthritis of knee: Secondary | ICD-10-CM | POA: Diagnosis not present

## 2021-11-23 DIAGNOSIS — I10 Essential (primary) hypertension: Secondary | ICD-10-CM | POA: Diagnosis not present

## 2021-11-23 DIAGNOSIS — M1990 Unspecified osteoarthritis, unspecified site: Secondary | ICD-10-CM | POA: Diagnosis not present

## 2021-11-23 DIAGNOSIS — F3341 Major depressive disorder, recurrent, in partial remission: Secondary | ICD-10-CM | POA: Diagnosis not present

## 2021-11-23 DIAGNOSIS — G8929 Other chronic pain: Secondary | ICD-10-CM | POA: Diagnosis not present

## 2021-11-23 DIAGNOSIS — F0390 Unspecified dementia without behavioral disturbance: Secondary | ICD-10-CM | POA: Diagnosis not present

## 2021-11-23 DIAGNOSIS — E785 Hyperlipidemia, unspecified: Secondary | ICD-10-CM | POA: Diagnosis not present

## 2021-11-23 DIAGNOSIS — M179 Osteoarthritis of knee, unspecified: Secondary | ICD-10-CM | POA: Diagnosis not present

## 2021-11-26 DIAGNOSIS — Z1152 Encounter for screening for COVID-19: Secondary | ICD-10-CM | POA: Diagnosis not present

## 2021-12-03 DIAGNOSIS — Z1152 Encounter for screening for COVID-19: Secondary | ICD-10-CM | POA: Diagnosis not present

## 2021-12-10 DIAGNOSIS — U071 COVID-19: Secondary | ICD-10-CM | POA: Diagnosis not present

## 2021-12-18 DIAGNOSIS — Z1152 Encounter for screening for COVID-19: Secondary | ICD-10-CM | POA: Diagnosis not present

## 2021-12-25 ENCOUNTER — Ambulatory Visit: Payer: Medicare PPO | Admitting: Diagnostic Neuroimaging

## 2021-12-25 ENCOUNTER — Encounter: Payer: Self-pay | Admitting: Diagnostic Neuroimaging

## 2021-12-25 ENCOUNTER — Other Ambulatory Visit: Payer: Self-pay

## 2021-12-25 VITALS — BP 128/78 | HR 73 | Ht <= 58 in | Wt 182.4 lb

## 2021-12-25 DIAGNOSIS — F03B18 Unspecified dementia, moderate, with other behavioral disturbance: Secondary | ICD-10-CM

## 2021-12-25 MED ORDER — MEMANTINE HCL 10 MG PO TABS: 10.0000 mg | ORAL_TABLET | Freq: Two times a day (BID) | ORAL | 12 refills | Status: DC
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
## 2021-12-25 NOTE — Patient Instructions (Signed)
MEMORY LOSS (moderate-severe dementia) - consider adding memantine 10mg  at bedtime; increase to twice a day after 1-2 weeks - safety / supervision issues reviewed - daily physical activity / exercise (at least 15-30 minutes) - eat more plants / vegetables (MIND diet) - increase social activities, brain stimulation, games, puzzles, hobbies, crafts, arts, music - aim for at least 7-8 hours sleep per night (or more) - avoid smoking and alcohol - caregiver resources provided  GAIT DIFFICULTY - cervical and lumbar spinal stenosis; patient wants conservative mgmt

## 2021-12-26 DIAGNOSIS — Z1152 Encounter for screening for COVID-19: Secondary | ICD-10-CM | POA: Diagnosis not present

## 2022-01-02 DIAGNOSIS — Z1152 Encounter for screening for COVID-19: Secondary | ICD-10-CM | POA: Diagnosis not present

## 2022-01-09 DIAGNOSIS — R350 Frequency of micturition: Secondary | ICD-10-CM | POA: Diagnosis not present

## 2022-01-09 DIAGNOSIS — E785 Hyperlipidemia, unspecified: Secondary | ICD-10-CM | POA: Diagnosis not present

## 2022-01-09 DIAGNOSIS — I10 Essential (primary) hypertension: Secondary | ICD-10-CM | POA: Diagnosis not present

## 2022-01-09 DIAGNOSIS — F0390 Unspecified dementia without behavioral disturbance: Secondary | ICD-10-CM | POA: Diagnosis not present

## 2022-01-09 DIAGNOSIS — Z1152 Encounter for screening for COVID-19: Secondary | ICD-10-CM | POA: Diagnosis not present

## 2022-02-15 DIAGNOSIS — Z1152 Encounter for screening for COVID-19: Secondary | ICD-10-CM | POA: Diagnosis not present

## 2022-02-21 ENCOUNTER — Telehealth: Payer: Self-pay | Admitting: Diagnostic Neuroimaging

## 2022-02-21 DIAGNOSIS — I10 Essential (primary) hypertension: Secondary | ICD-10-CM | POA: Diagnosis not present

## 2022-02-21 DIAGNOSIS — E785 Hyperlipidemia, unspecified: Secondary | ICD-10-CM | POA: Diagnosis not present

## 2022-02-21 MED ORDER — MEMANTINE HCL 10 MG PO TABS: 10.0000 mg | ORAL_TABLET | Freq: Two times a day (BID) | ORAL | 12 refills | Status: DC

## 2022-02-21 NOTE — Telephone Encounter (Signed)
Prescription sent to [facility] as requested.  ?

## 2022-02-21 NOTE — Telephone Encounter (Signed)
[facility]) need original prescription for memantine (NAMENDA) 10 MG tablet .  ?Send prescription to  ?[facility],  ?Phone: [phone].  ? ?Pt will start March 15 due to pt gets medication packaged.  ?

## 2022-02-24 DIAGNOSIS — Z1152 Encounter for screening for COVID-19: Secondary | ICD-10-CM | POA: Diagnosis not present

## 2022-03-01 DIAGNOSIS — Z1152 Encounter for screening for COVID-19: Secondary | ICD-10-CM | POA: Diagnosis not present

## 2022-03-09 DIAGNOSIS — Z1152 Encounter for screening for COVID-19: Secondary | ICD-10-CM | POA: Diagnosis not present

## 2022-03-16 DIAGNOSIS — Z1152 Encounter for screening for COVID-19: Secondary | ICD-10-CM | POA: Diagnosis not present

## 2022-04-07 DIAGNOSIS — Z1152 Encounter for screening for COVID-19: Secondary | ICD-10-CM | POA: Diagnosis not present

## 2022-04-13 DIAGNOSIS — Z1152 Encounter for screening for COVID-19: Secondary | ICD-10-CM | POA: Diagnosis not present

## 2022-04-19 DIAGNOSIS — Z1152 Encounter for screening for COVID-19: Secondary | ICD-10-CM | POA: Diagnosis not present

## 2022-04-26 DIAGNOSIS — Z1152 Encounter for screening for COVID-19: Secondary | ICD-10-CM | POA: Diagnosis not present

## 2022-05-05 DIAGNOSIS — Z1152 Encounter for screening for COVID-19: Secondary | ICD-10-CM | POA: Diagnosis not present

## 2022-05-11 DIAGNOSIS — Z1152 Encounter for screening for COVID-19: Secondary | ICD-10-CM | POA: Diagnosis not present

## 2022-05-17 DIAGNOSIS — Z1152 Encounter for screening for COVID-19: Secondary | ICD-10-CM | POA: Diagnosis not present

## 2022-06-11 DIAGNOSIS — Z1152 Encounter for screening for COVID-19: Secondary | ICD-10-CM | POA: Diagnosis not present

## 2022-06-17 DIAGNOSIS — E785 Hyperlipidemia, unspecified: Secondary | ICD-10-CM | POA: Diagnosis not present

## 2022-06-17 DIAGNOSIS — F0390 Unspecified dementia without behavioral disturbance: Secondary | ICD-10-CM | POA: Diagnosis not present

## 2022-06-17 DIAGNOSIS — I10 Essential (primary) hypertension: Secondary | ICD-10-CM | POA: Diagnosis not present

## 2022-06-17 DIAGNOSIS — F3341 Major depressive disorder, recurrent, in partial remission: Secondary | ICD-10-CM | POA: Diagnosis not present

## 2022-06-17 DIAGNOSIS — G8929 Other chronic pain: Secondary | ICD-10-CM | POA: Diagnosis not present

## 2022-06-28 DIAGNOSIS — Z1152 Encounter for screening for COVID-19: Secondary | ICD-10-CM | POA: Diagnosis not present

## 2022-07-05 DIAGNOSIS — Z1152 Encounter for screening for COVID-19: Secondary | ICD-10-CM | POA: Diagnosis not present

## 2022-07-25 DIAGNOSIS — Z1152 Encounter for screening for COVID-19: Secondary | ICD-10-CM | POA: Diagnosis not present

## 2022-07-26 DIAGNOSIS — Z1152 Encounter for screening for COVID-19: Secondary | ICD-10-CM | POA: Diagnosis not present

## 2022-08-19 DIAGNOSIS — Z1152 Encounter for screening for COVID-19: Secondary | ICD-10-CM | POA: Diagnosis not present

## 2022-08-20 DIAGNOSIS — M1990 Unspecified osteoarthritis, unspecified site: Secondary | ICD-10-CM | POA: Diagnosis not present

## 2022-08-20 DIAGNOSIS — E785 Hyperlipidemia, unspecified: Secondary | ICD-10-CM | POA: Diagnosis not present

## 2022-08-20 DIAGNOSIS — I10 Essential (primary) hypertension: Secondary | ICD-10-CM | POA: Diagnosis not present

## 2022-08-20 DIAGNOSIS — R54 Age-related physical debility: Secondary | ICD-10-CM | POA: Diagnosis not present

## 2022-08-20 DIAGNOSIS — Z0000 Encounter for general adult medical examination without abnormal findings: Secondary | ICD-10-CM | POA: Diagnosis not present

## 2022-08-20 DIAGNOSIS — F0390 Unspecified dementia without behavioral disturbance: Secondary | ICD-10-CM | POA: Diagnosis not present

## 2022-08-20 DIAGNOSIS — R2681 Unsteadiness on feet: Secondary | ICD-10-CM | POA: Diagnosis not present

## 2022-08-20 DIAGNOSIS — F3341 Major depressive disorder, recurrent, in partial remission: Secondary | ICD-10-CM | POA: Diagnosis not present

## 2022-08-20 DIAGNOSIS — N3941 Urge incontinence: Secondary | ICD-10-CM | POA: Diagnosis not present

## 2022-08-22 DIAGNOSIS — E785 Hyperlipidemia, unspecified: Secondary | ICD-10-CM | POA: Diagnosis not present

## 2022-08-22 DIAGNOSIS — G8929 Other chronic pain: Secondary | ICD-10-CM | POA: Diagnosis not present

## 2022-08-22 DIAGNOSIS — I10 Essential (primary) hypertension: Secondary | ICD-10-CM | POA: Diagnosis not present

## 2022-08-22 DIAGNOSIS — F3341 Major depressive disorder, recurrent, in partial remission: Secondary | ICD-10-CM | POA: Diagnosis not present

## 2022-08-22 DIAGNOSIS — F0390 Unspecified dementia without behavioral disturbance: Secondary | ICD-10-CM | POA: Diagnosis not present

## 2022-08-22 DIAGNOSIS — M179 Osteoarthritis of knee, unspecified: Secondary | ICD-10-CM | POA: Diagnosis not present

## 2022-08-22 DIAGNOSIS — Z1152 Encounter for screening for COVID-19: Secondary | ICD-10-CM | POA: Diagnosis not present

## 2022-09-13 DIAGNOSIS — Z1152 Encounter for screening for COVID-19: Secondary | ICD-10-CM | POA: Diagnosis not present

## 2022-09-19 DIAGNOSIS — Z1152 Encounter for screening for COVID-19: Secondary | ICD-10-CM | POA: Diagnosis not present

## 2022-09-27 DIAGNOSIS — Z1152 Encounter for screening for COVID-19: Secondary | ICD-10-CM | POA: Diagnosis not present

## 2022-09-30 ENCOUNTER — Encounter: Payer: Self-pay | Admitting: Diagnostic Neuroimaging

## 2022-09-30 ENCOUNTER — Telehealth: Payer: Self-pay | Admitting: Diagnostic Neuroimaging

## 2022-09-30 NOTE — Telephone Encounter (Signed)
Pt's niece is requesting a letter for pt's bank from Dr Leta Baptist stating pt's diagnosis prevents her from handling her financial affairs.

## 2022-09-30 NOTE — Telephone Encounter (Signed)
She is Special educational needs teacher

## 2022-09-30 NOTE — Telephone Encounter (Signed)
Ok to provide letter to niece stating pt is unable to handle finances due to dementia ?

## 2022-09-30 NOTE — Telephone Encounter (Signed)
Letter signed, emailed to niece at [email or web address]. confirmation received.

## 2022-09-30 NOTE — Telephone Encounter (Signed)
Bank will not take POA alone, need letter as well. Do you want me to have her send [place] copy of it FPOA?

## 2022-10-04 DIAGNOSIS — Z1152 Encounter for screening for COVID-19: Secondary | ICD-10-CM | POA: Diagnosis not present

## 2022-10-10 DIAGNOSIS — Z1152 Encounter for screening for COVID-19: Secondary | ICD-10-CM | POA: Diagnosis not present

## 2022-10-11 ENCOUNTER — Other Ambulatory Visit: Payer: Self-pay | Admitting: Internal Medicine

## 2022-10-11 DIAGNOSIS — Z1231 Encounter for screening mammogram for malignant neoplasm of breast: Secondary | ICD-10-CM

## 2022-10-17 DIAGNOSIS — Z1152 Encounter for screening for COVID-19: Secondary | ICD-10-CM | POA: Diagnosis not present

## 2022-10-25 DIAGNOSIS — Z1152 Encounter for screening for COVID-19: Secondary | ICD-10-CM | POA: Diagnosis not present

## 2022-11-06 DIAGNOSIS — Z1152 Encounter for screening for COVID-19: Secondary | ICD-10-CM | POA: Diagnosis not present

## 2022-11-10 DIAGNOSIS — Z1152 Encounter for screening for COVID-19: Secondary | ICD-10-CM | POA: Diagnosis not present

## 2022-11-22 DIAGNOSIS — Z1152 Encounter for screening for COVID-19: Secondary | ICD-10-CM | POA: Diagnosis not present

## 2022-12-03 ENCOUNTER — Ambulatory Visit
Admission: RE | Admit: 2022-12-03 | Discharge: 2022-12-03 | Disposition: A | Discharge status: Home / Self Care | Payer: Medicare PPO | Source: Ambulatory Visit | Attending: Internal Medicine | Admitting: Internal Medicine

## 2022-12-03 ENCOUNTER — Ambulatory Visit: Payer: Medicare PPO

## 2022-12-03 DIAGNOSIS — Z1231 Encounter for screening mammogram for malignant neoplasm of breast: Secondary | ICD-10-CM

## 2022-12-03 DIAGNOSIS — Z1152 Encounter for screening for COVID-19: Secondary | ICD-10-CM | POA: Diagnosis not present

## 2022-12-07 IMAGING — CT CT HEAD W/O CM
1 series · 16 of 30 positions shown, 20 images · non-contrast
Comparison: MRI head 04/11/2017

CLINICAL DATA: Fall, hit head

EXAM:
CT HEAD WITHOUT CONTRAST
TECHNIQUE: Contiguous axial images were obtained from the base of the skull
through the vertex without intravenous contrast.

[Series 2: head w/(date) · axial · 0.45mm/px · z∈[-164,-4]mm · 16 of 36 slices shown, 20 images]
[im 2/36  brain]
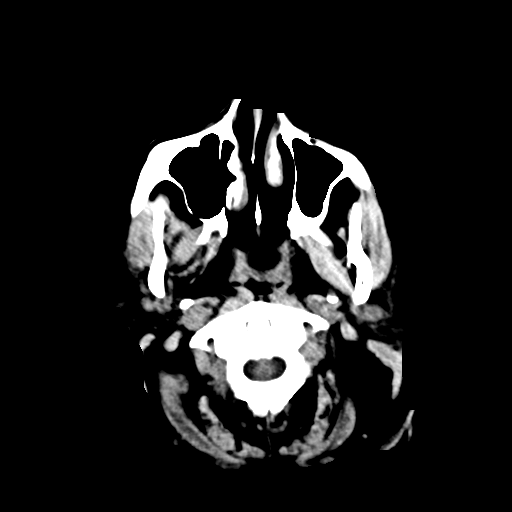
[im 2/36  bone]
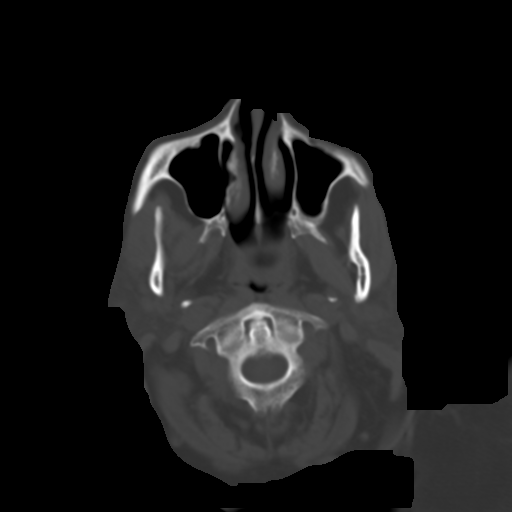
[im 4/36  brain]
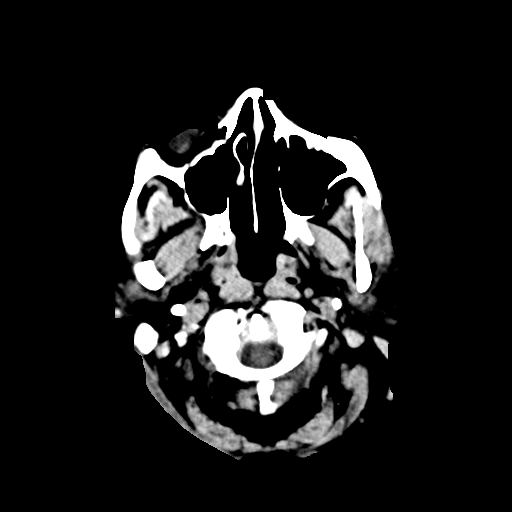
[im 7/36  brain]
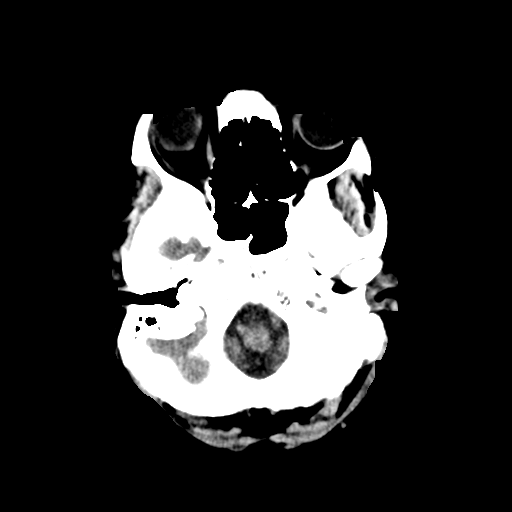
[im 9/36  brain]
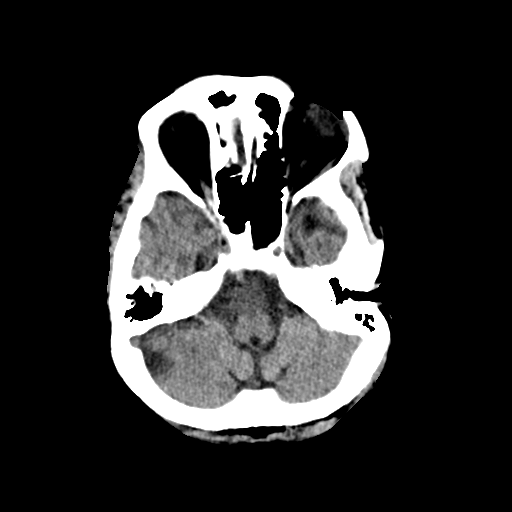
[im 10/36  brain]
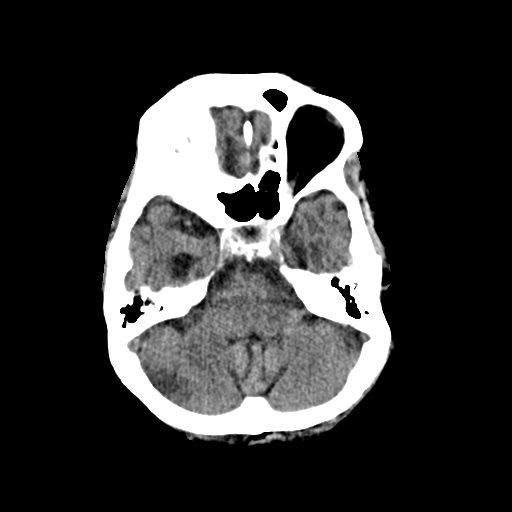
[im 10/36  bone]
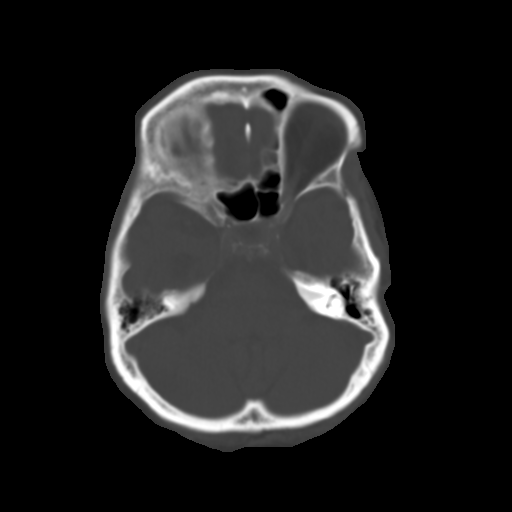
[im 13/36  brain]
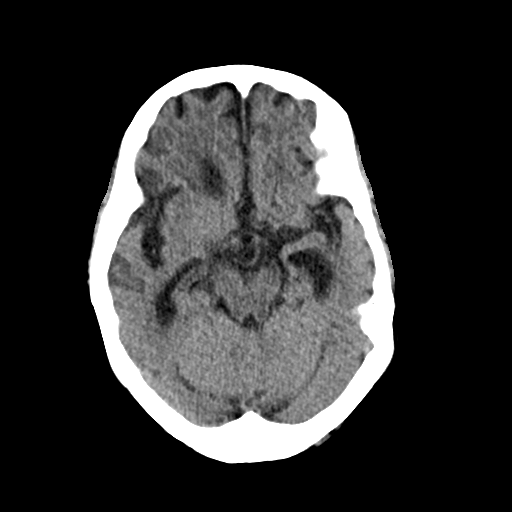
[im 15/36  brain]
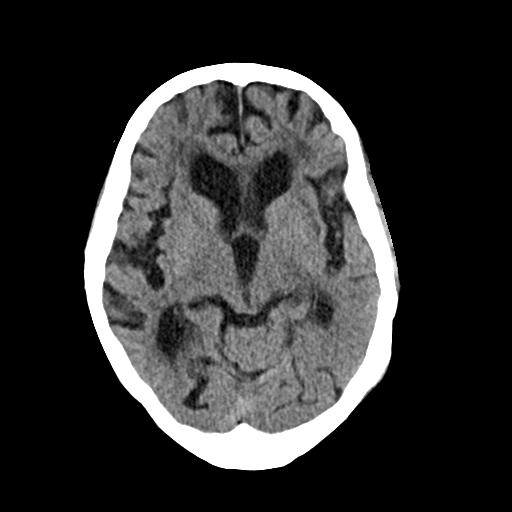
[im 17/36  brain]
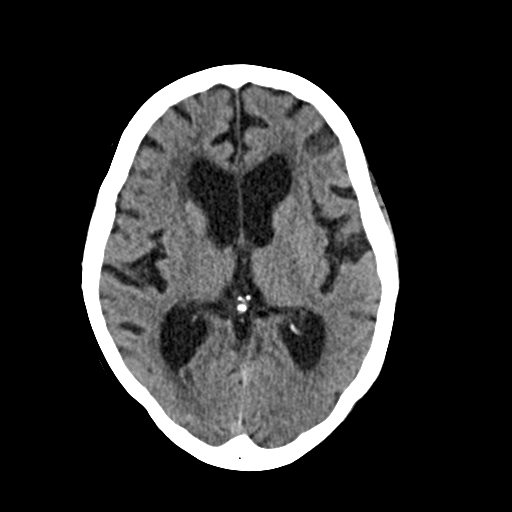
[im 19/36  brain]
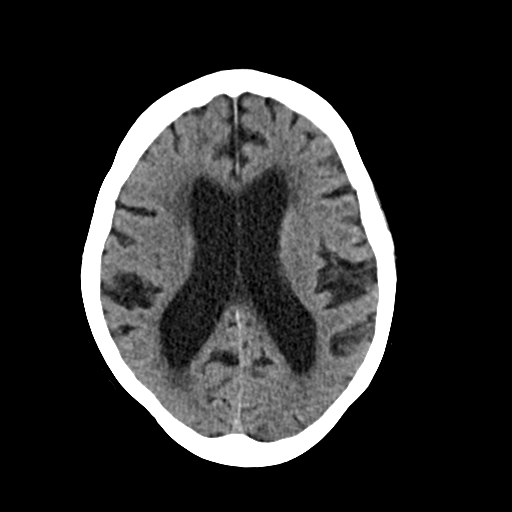
[im 19/36  bone]
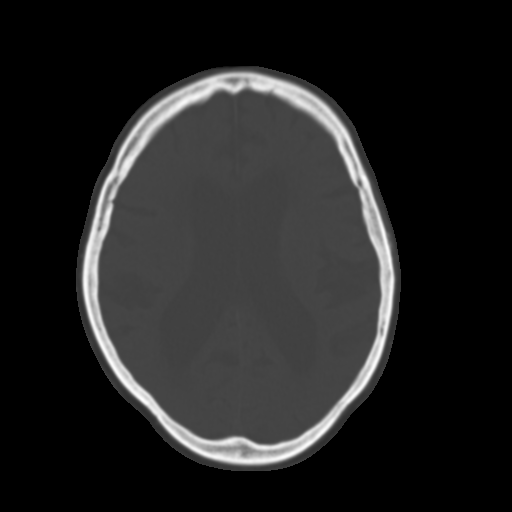
[im 21/36  brain]
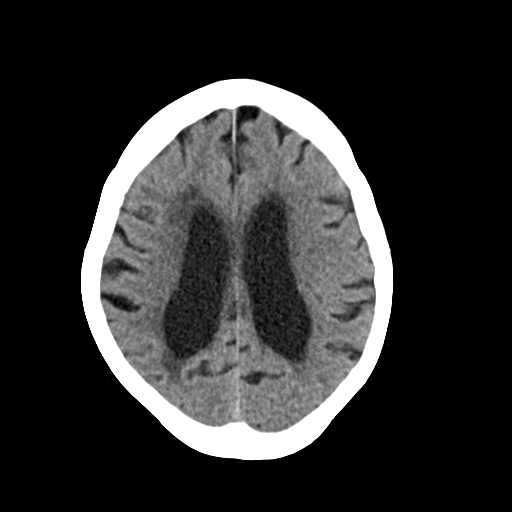
[im 23/36  brain]
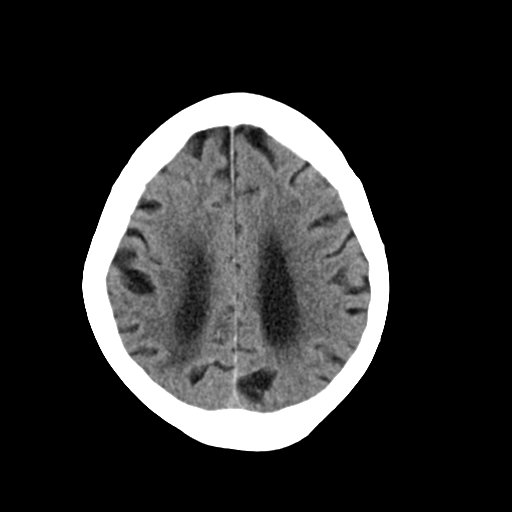
[im 26/36  brain]
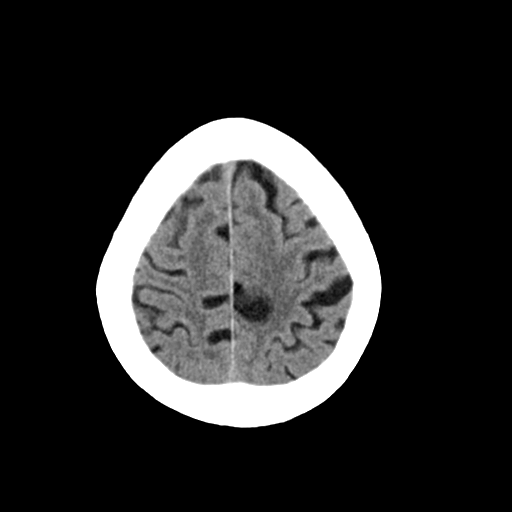
[im 27/36  brain]
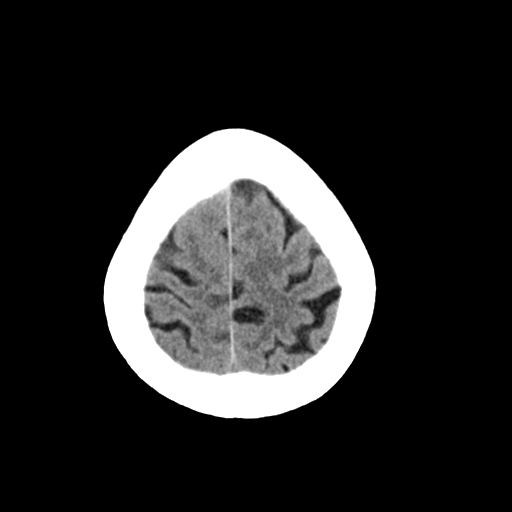
[im 27/36  bone]
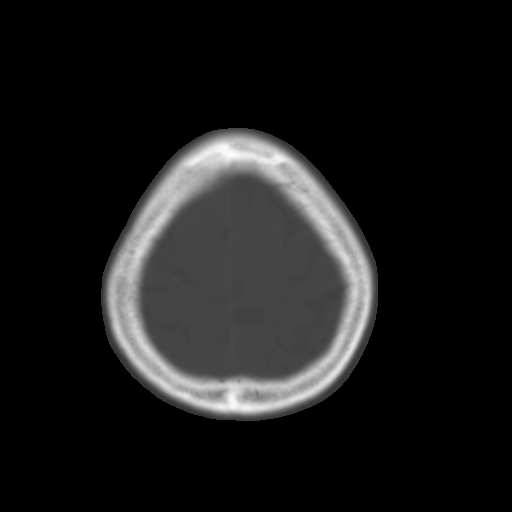
[im 29/36  brain]
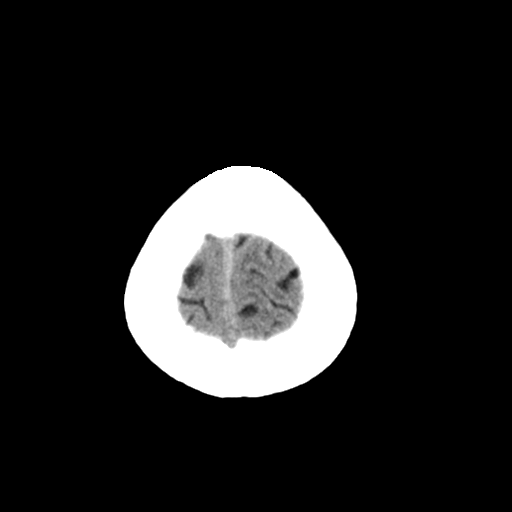
[im 32/36  brain]
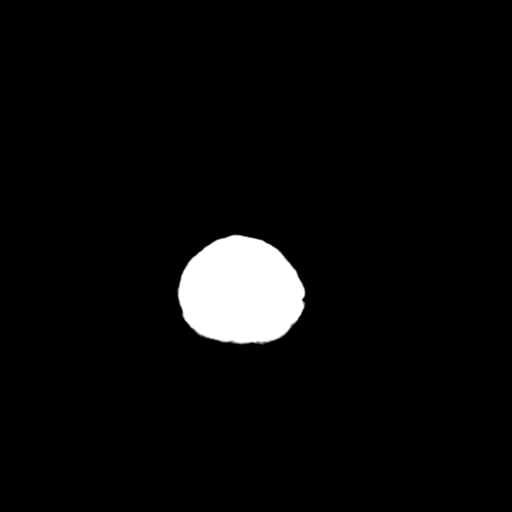
[im 34/36  brain]
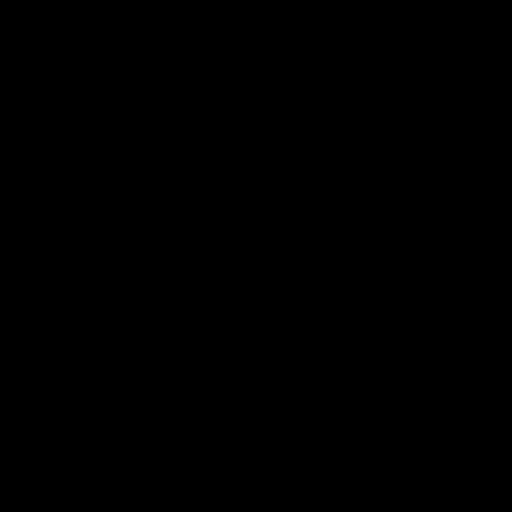

[16 of 30 positions shown; findings below may reference images not displayed]

FINDINGS: Brain: No evidence of acute intracranial hemorrhage or extra-axial
collection.No evidence of mass lesion/concern mass effect.The
ventricles are unchanged in size.Confluent periventricular and
subcortical white matter hypoattenuation, which is nonspecific but
likely sequela of chronic small vessel ischemic disease.Moderate
cerebral atrophy

Vascular: No hyperdense vessel or unexpected calcification.

Skull: Normal. Negative for fracture or focal lesion.

Sinuses/Orbits: No acute finding.

Other: None.
IMPRESSION: No acute intracranial abnormality.

Sequela of chronic small vessel ischemic disease.

## 2022-12-10 DIAGNOSIS — Z1152 Encounter for screening for COVID-19: Secondary | ICD-10-CM | POA: Diagnosis not present

## 2022-12-21 DIAGNOSIS — Z1152 Encounter for screening for COVID-19: Secondary | ICD-10-CM | POA: Diagnosis not present

## 2023-02-17 DIAGNOSIS — M25551 Pain in right hip: Secondary | ICD-10-CM | POA: Diagnosis not present

## 2023-02-17 DIAGNOSIS — E785 Hyperlipidemia, unspecified: Secondary | ICD-10-CM | POA: Diagnosis not present

## 2023-02-17 DIAGNOSIS — R609 Edema, unspecified: Secondary | ICD-10-CM | POA: Diagnosis not present

## 2023-02-17 DIAGNOSIS — F0390 Unspecified dementia without behavioral disturbance: Secondary | ICD-10-CM | POA: Diagnosis not present

## 2023-02-17 DIAGNOSIS — F3341 Major depressive disorder, recurrent, in partial remission: Secondary | ICD-10-CM | POA: Diagnosis not present

## 2023-02-17 DIAGNOSIS — M170 Bilateral primary osteoarthritis of knee: Secondary | ICD-10-CM | POA: Diagnosis not present

## 2023-02-17 DIAGNOSIS — W19XXXA Unspecified fall, initial encounter: Secondary | ICD-10-CM | POA: Diagnosis not present

## 2023-03-03 DIAGNOSIS — E785 Hyperlipidemia, unspecified: Secondary | ICD-10-CM | POA: Diagnosis not present

## 2023-03-03 DIAGNOSIS — Z87891 Personal history of nicotine dependence: Secondary | ICD-10-CM | POA: Diagnosis not present

## 2023-03-03 DIAGNOSIS — G8929 Other chronic pain: Secondary | ICD-10-CM | POA: Diagnosis not present

## 2023-03-03 DIAGNOSIS — F3341 Major depressive disorder, recurrent, in partial remission: Secondary | ICD-10-CM | POA: Diagnosis not present

## 2023-03-03 DIAGNOSIS — I10 Essential (primary) hypertension: Secondary | ICD-10-CM | POA: Diagnosis not present

## 2023-03-03 DIAGNOSIS — F0393 Unspecified dementia, unspecified severity, with mood disturbance: Secondary | ICD-10-CM | POA: Diagnosis not present

## 2023-03-03 DIAGNOSIS — M170 Bilateral primary osteoarthritis of knee: Secondary | ICD-10-CM | POA: Diagnosis not present

## 2023-03-03 DIAGNOSIS — N3941 Urge incontinence: Secondary | ICD-10-CM | POA: Diagnosis not present

## 2023-03-10 DIAGNOSIS — F3341 Major depressive disorder, recurrent, in partial remission: Secondary | ICD-10-CM | POA: Diagnosis not present

## 2023-03-10 DIAGNOSIS — F0393 Unspecified dementia, unspecified severity, with mood disturbance: Secondary | ICD-10-CM | POA: Diagnosis not present

## 2023-03-10 DIAGNOSIS — M170 Bilateral primary osteoarthritis of knee: Secondary | ICD-10-CM | POA: Diagnosis not present

## 2023-03-10 DIAGNOSIS — I10 Essential (primary) hypertension: Secondary | ICD-10-CM | POA: Diagnosis not present

## 2023-03-10 DIAGNOSIS — Z87891 Personal history of nicotine dependence: Secondary | ICD-10-CM | POA: Diagnosis not present

## 2023-03-10 DIAGNOSIS — N3941 Urge incontinence: Secondary | ICD-10-CM | POA: Diagnosis not present

## 2023-03-10 DIAGNOSIS — G8929 Other chronic pain: Secondary | ICD-10-CM | POA: Diagnosis not present

## 2023-03-10 DIAGNOSIS — E785 Hyperlipidemia, unspecified: Secondary | ICD-10-CM | POA: Diagnosis not present

## 2023-03-14 DIAGNOSIS — M170 Bilateral primary osteoarthritis of knee: Secondary | ICD-10-CM | POA: Diagnosis not present

## 2023-03-14 DIAGNOSIS — F3341 Major depressive disorder, recurrent, in partial remission: Secondary | ICD-10-CM | POA: Diagnosis not present

## 2023-03-14 DIAGNOSIS — Z87891 Personal history of nicotine dependence: Secondary | ICD-10-CM | POA: Diagnosis not present

## 2023-03-14 DIAGNOSIS — E785 Hyperlipidemia, unspecified: Secondary | ICD-10-CM | POA: Diagnosis not present

## 2023-03-14 DIAGNOSIS — I10 Essential (primary) hypertension: Secondary | ICD-10-CM | POA: Diagnosis not present

## 2023-03-14 DIAGNOSIS — F0393 Unspecified dementia, unspecified severity, with mood disturbance: Secondary | ICD-10-CM | POA: Diagnosis not present

## 2023-03-14 DIAGNOSIS — N3941 Urge incontinence: Secondary | ICD-10-CM | POA: Diagnosis not present

## 2023-03-14 DIAGNOSIS — G8929 Other chronic pain: Secondary | ICD-10-CM | POA: Diagnosis not present

## 2023-03-18 DIAGNOSIS — N3941 Urge incontinence: Secondary | ICD-10-CM | POA: Diagnosis not present

## 2023-03-18 DIAGNOSIS — Z87891 Personal history of nicotine dependence: Secondary | ICD-10-CM | POA: Diagnosis not present

## 2023-03-18 DIAGNOSIS — F3341 Major depressive disorder, recurrent, in partial remission: Secondary | ICD-10-CM | POA: Diagnosis not present

## 2023-03-18 DIAGNOSIS — I10 Essential (primary) hypertension: Secondary | ICD-10-CM | POA: Diagnosis not present

## 2023-03-18 DIAGNOSIS — M170 Bilateral primary osteoarthritis of knee: Secondary | ICD-10-CM | POA: Diagnosis not present

## 2023-03-18 DIAGNOSIS — G8929 Other chronic pain: Secondary | ICD-10-CM | POA: Diagnosis not present

## 2023-03-18 DIAGNOSIS — E785 Hyperlipidemia, unspecified: Secondary | ICD-10-CM | POA: Diagnosis not present

## 2023-03-18 DIAGNOSIS — F0393 Unspecified dementia, unspecified severity, with mood disturbance: Secondary | ICD-10-CM | POA: Diagnosis not present

## 2023-03-21 DIAGNOSIS — F0393 Unspecified dementia, unspecified severity, with mood disturbance: Secondary | ICD-10-CM | POA: Diagnosis not present

## 2023-03-21 DIAGNOSIS — E785 Hyperlipidemia, unspecified: Secondary | ICD-10-CM | POA: Diagnosis not present

## 2023-03-21 DIAGNOSIS — F3341 Major depressive disorder, recurrent, in partial remission: Secondary | ICD-10-CM | POA: Diagnosis not present

## 2023-03-21 DIAGNOSIS — N3941 Urge incontinence: Secondary | ICD-10-CM | POA: Diagnosis not present

## 2023-03-21 DIAGNOSIS — Z87891 Personal history of nicotine dependence: Secondary | ICD-10-CM | POA: Diagnosis not present

## 2023-03-21 DIAGNOSIS — I10 Essential (primary) hypertension: Secondary | ICD-10-CM | POA: Diagnosis not present

## 2023-03-21 DIAGNOSIS — M170 Bilateral primary osteoarthritis of knee: Secondary | ICD-10-CM | POA: Diagnosis not present

## 2023-03-21 DIAGNOSIS — G8929 Other chronic pain: Secondary | ICD-10-CM | POA: Diagnosis not present

## 2023-03-24 DIAGNOSIS — N3941 Urge incontinence: Secondary | ICD-10-CM | POA: Diagnosis not present

## 2023-03-24 DIAGNOSIS — I10 Essential (primary) hypertension: Secondary | ICD-10-CM | POA: Diagnosis not present

## 2023-03-24 DIAGNOSIS — E785 Hyperlipidemia, unspecified: Secondary | ICD-10-CM | POA: Diagnosis not present

## 2023-03-24 DIAGNOSIS — M170 Bilateral primary osteoarthritis of knee: Secondary | ICD-10-CM | POA: Diagnosis not present

## 2023-03-24 DIAGNOSIS — Z87891 Personal history of nicotine dependence: Secondary | ICD-10-CM | POA: Diagnosis not present

## 2023-03-24 DIAGNOSIS — G8929 Other chronic pain: Secondary | ICD-10-CM | POA: Diagnosis not present

## 2023-03-24 DIAGNOSIS — F0393 Unspecified dementia, unspecified severity, with mood disturbance: Secondary | ICD-10-CM | POA: Diagnosis not present

## 2023-03-24 DIAGNOSIS — F3341 Major depressive disorder, recurrent, in partial remission: Secondary | ICD-10-CM | POA: Diagnosis not present

## 2023-03-28 DIAGNOSIS — Z87891 Personal history of nicotine dependence: Secondary | ICD-10-CM | POA: Diagnosis not present

## 2023-03-28 DIAGNOSIS — E785 Hyperlipidemia, unspecified: Secondary | ICD-10-CM | POA: Diagnosis not present

## 2023-03-28 DIAGNOSIS — G8929 Other chronic pain: Secondary | ICD-10-CM | POA: Diagnosis not present

## 2023-03-28 DIAGNOSIS — I10 Essential (primary) hypertension: Secondary | ICD-10-CM | POA: Diagnosis not present

## 2023-03-28 DIAGNOSIS — F3341 Major depressive disorder, recurrent, in partial remission: Secondary | ICD-10-CM | POA: Diagnosis not present

## 2023-03-28 DIAGNOSIS — F0393 Unspecified dementia, unspecified severity, with mood disturbance: Secondary | ICD-10-CM | POA: Diagnosis not present

## 2023-03-28 DIAGNOSIS — M170 Bilateral primary osteoarthritis of knee: Secondary | ICD-10-CM | POA: Diagnosis not present

## 2023-03-28 DIAGNOSIS — N3941 Urge incontinence: Secondary | ICD-10-CM | POA: Diagnosis not present

## 2023-03-31 DIAGNOSIS — N3941 Urge incontinence: Secondary | ICD-10-CM | POA: Diagnosis not present

## 2023-03-31 DIAGNOSIS — E785 Hyperlipidemia, unspecified: Secondary | ICD-10-CM | POA: Diagnosis not present

## 2023-03-31 DIAGNOSIS — M170 Bilateral primary osteoarthritis of knee: Secondary | ICD-10-CM | POA: Diagnosis not present

## 2023-03-31 DIAGNOSIS — F0393 Unspecified dementia, unspecified severity, with mood disturbance: Secondary | ICD-10-CM | POA: Diagnosis not present

## 2023-03-31 DIAGNOSIS — Z87891 Personal history of nicotine dependence: Secondary | ICD-10-CM | POA: Diagnosis not present

## 2023-03-31 DIAGNOSIS — G8929 Other chronic pain: Secondary | ICD-10-CM | POA: Diagnosis not present

## 2023-03-31 DIAGNOSIS — F3341 Major depressive disorder, recurrent, in partial remission: Secondary | ICD-10-CM | POA: Diagnosis not present

## 2023-03-31 DIAGNOSIS — I10 Essential (primary) hypertension: Secondary | ICD-10-CM | POA: Diagnosis not present

## 2023-04-02 DIAGNOSIS — F3341 Major depressive disorder, recurrent, in partial remission: Secondary | ICD-10-CM | POA: Diagnosis not present

## 2023-04-02 DIAGNOSIS — G8929 Other chronic pain: Secondary | ICD-10-CM | POA: Diagnosis not present

## 2023-04-02 DIAGNOSIS — N3941 Urge incontinence: Secondary | ICD-10-CM | POA: Diagnosis not present

## 2023-04-02 DIAGNOSIS — E785 Hyperlipidemia, unspecified: Secondary | ICD-10-CM | POA: Diagnosis not present

## 2023-04-02 DIAGNOSIS — M170 Bilateral primary osteoarthritis of knee: Secondary | ICD-10-CM | POA: Diagnosis not present

## 2023-04-02 DIAGNOSIS — Z87891 Personal history of nicotine dependence: Secondary | ICD-10-CM | POA: Diagnosis not present

## 2023-04-02 DIAGNOSIS — F0393 Unspecified dementia, unspecified severity, with mood disturbance: Secondary | ICD-10-CM | POA: Diagnosis not present

## 2023-04-02 DIAGNOSIS — I10 Essential (primary) hypertension: Secondary | ICD-10-CM | POA: Diagnosis not present

## 2023-04-04 DIAGNOSIS — F3341 Major depressive disorder, recurrent, in partial remission: Secondary | ICD-10-CM | POA: Diagnosis not present

## 2023-04-04 DIAGNOSIS — N3941 Urge incontinence: Secondary | ICD-10-CM | POA: Diagnosis not present

## 2023-04-04 DIAGNOSIS — Z87891 Personal history of nicotine dependence: Secondary | ICD-10-CM | POA: Diagnosis not present

## 2023-04-04 DIAGNOSIS — G8929 Other chronic pain: Secondary | ICD-10-CM | POA: Diagnosis not present

## 2023-04-04 DIAGNOSIS — I10 Essential (primary) hypertension: Secondary | ICD-10-CM | POA: Diagnosis not present

## 2023-04-04 DIAGNOSIS — F0393 Unspecified dementia, unspecified severity, with mood disturbance: Secondary | ICD-10-CM | POA: Diagnosis not present

## 2023-04-04 DIAGNOSIS — E785 Hyperlipidemia, unspecified: Secondary | ICD-10-CM | POA: Diagnosis not present

## 2023-04-04 DIAGNOSIS — M170 Bilateral primary osteoarthritis of knee: Secondary | ICD-10-CM | POA: Diagnosis not present

## 2023-04-11 DIAGNOSIS — Z87891 Personal history of nicotine dependence: Secondary | ICD-10-CM | POA: Diagnosis not present

## 2023-04-11 DIAGNOSIS — M170 Bilateral primary osteoarthritis of knee: Secondary | ICD-10-CM | POA: Diagnosis not present

## 2023-04-11 DIAGNOSIS — I10 Essential (primary) hypertension: Secondary | ICD-10-CM | POA: Diagnosis not present

## 2023-04-11 DIAGNOSIS — E785 Hyperlipidemia, unspecified: Secondary | ICD-10-CM | POA: Diagnosis not present

## 2023-04-11 DIAGNOSIS — F3341 Major depressive disorder, recurrent, in partial remission: Secondary | ICD-10-CM | POA: Diagnosis not present

## 2023-04-11 DIAGNOSIS — F0393 Unspecified dementia, unspecified severity, with mood disturbance: Secondary | ICD-10-CM | POA: Diagnosis not present

## 2023-04-11 DIAGNOSIS — N3941 Urge incontinence: Secondary | ICD-10-CM | POA: Diagnosis not present

## 2023-04-11 DIAGNOSIS — G8929 Other chronic pain: Secondary | ICD-10-CM | POA: Diagnosis not present

## 2023-04-14 DIAGNOSIS — F3341 Major depressive disorder, recurrent, in partial remission: Secondary | ICD-10-CM | POA: Diagnosis not present

## 2023-04-14 DIAGNOSIS — N3941 Urge incontinence: Secondary | ICD-10-CM | POA: Diagnosis not present

## 2023-04-14 DIAGNOSIS — E785 Hyperlipidemia, unspecified: Secondary | ICD-10-CM | POA: Diagnosis not present

## 2023-04-14 DIAGNOSIS — I10 Essential (primary) hypertension: Secondary | ICD-10-CM | POA: Diagnosis not present

## 2023-04-14 DIAGNOSIS — G8929 Other chronic pain: Secondary | ICD-10-CM | POA: Diagnosis not present

## 2023-04-14 DIAGNOSIS — M170 Bilateral primary osteoarthritis of knee: Secondary | ICD-10-CM | POA: Diagnosis not present

## 2023-04-14 DIAGNOSIS — F0393 Unspecified dementia, unspecified severity, with mood disturbance: Secondary | ICD-10-CM | POA: Diagnosis not present

## 2023-04-14 DIAGNOSIS — Z87891 Personal history of nicotine dependence: Secondary | ICD-10-CM | POA: Diagnosis not present

## 2023-04-18 DIAGNOSIS — F0393 Unspecified dementia, unspecified severity, with mood disturbance: Secondary | ICD-10-CM | POA: Diagnosis not present

## 2023-04-18 DIAGNOSIS — F3341 Major depressive disorder, recurrent, in partial remission: Secondary | ICD-10-CM | POA: Diagnosis not present

## 2023-04-18 DIAGNOSIS — G8929 Other chronic pain: Secondary | ICD-10-CM | POA: Diagnosis not present

## 2023-04-18 DIAGNOSIS — N3941 Urge incontinence: Secondary | ICD-10-CM | POA: Diagnosis not present

## 2023-04-18 DIAGNOSIS — Z87891 Personal history of nicotine dependence: Secondary | ICD-10-CM | POA: Diagnosis not present

## 2023-04-18 DIAGNOSIS — E785 Hyperlipidemia, unspecified: Secondary | ICD-10-CM | POA: Diagnosis not present

## 2023-04-18 DIAGNOSIS — M170 Bilateral primary osteoarthritis of knee: Secondary | ICD-10-CM | POA: Diagnosis not present

## 2023-04-18 DIAGNOSIS — I10 Essential (primary) hypertension: Secondary | ICD-10-CM | POA: Diagnosis not present

## 2023-04-21 DIAGNOSIS — F0393 Unspecified dementia, unspecified severity, with mood disturbance: Secondary | ICD-10-CM | POA: Diagnosis not present

## 2023-04-21 DIAGNOSIS — Z87891 Personal history of nicotine dependence: Secondary | ICD-10-CM | POA: Diagnosis not present

## 2023-04-21 DIAGNOSIS — N3941 Urge incontinence: Secondary | ICD-10-CM | POA: Diagnosis not present

## 2023-04-21 DIAGNOSIS — G8929 Other chronic pain: Secondary | ICD-10-CM | POA: Diagnosis not present

## 2023-04-21 DIAGNOSIS — E785 Hyperlipidemia, unspecified: Secondary | ICD-10-CM | POA: Diagnosis not present

## 2023-04-21 DIAGNOSIS — M170 Bilateral primary osteoarthritis of knee: Secondary | ICD-10-CM | POA: Diagnosis not present

## 2023-04-21 DIAGNOSIS — F3341 Major depressive disorder, recurrent, in partial remission: Secondary | ICD-10-CM | POA: Diagnosis not present

## 2023-04-21 DIAGNOSIS — I10 Essential (primary) hypertension: Secondary | ICD-10-CM | POA: Diagnosis not present

## 2023-05-01 DIAGNOSIS — I10 Essential (primary) hypertension: Secondary | ICD-10-CM | POA: Diagnosis not present

## 2023-05-01 DIAGNOSIS — N3941 Urge incontinence: Secondary | ICD-10-CM | POA: Diagnosis not present

## 2023-05-01 DIAGNOSIS — F3341 Major depressive disorder, recurrent, in partial remission: Secondary | ICD-10-CM | POA: Diagnosis not present

## 2023-05-01 DIAGNOSIS — G8929 Other chronic pain: Secondary | ICD-10-CM | POA: Diagnosis not present

## 2023-05-01 DIAGNOSIS — E785 Hyperlipidemia, unspecified: Secondary | ICD-10-CM | POA: Diagnosis not present

## 2023-05-01 DIAGNOSIS — M170 Bilateral primary osteoarthritis of knee: Secondary | ICD-10-CM | POA: Diagnosis not present

## 2023-05-01 DIAGNOSIS — Z87891 Personal history of nicotine dependence: Secondary | ICD-10-CM | POA: Diagnosis not present

## 2023-05-01 DIAGNOSIS — F0393 Unspecified dementia, unspecified severity, with mood disturbance: Secondary | ICD-10-CM | POA: Diagnosis not present

## 2023-07-15 DIAGNOSIS — R399 Unspecified symptoms and signs involving the genitourinary system: Secondary | ICD-10-CM | POA: Diagnosis not present

## 2023-07-17 ENCOUNTER — Emergency Department (HOSPITAL_COMMUNITY)
Admission: EM | Admit: 2023-07-17 | Discharge: 2023-07-17 | Disposition: A | Discharge status: Home / Self Care | Payer: Medicare PPO

## 2023-07-17 ENCOUNTER — Other Ambulatory Visit: Payer: Self-pay

## 2023-07-17 ENCOUNTER — Emergency Department (HOSPITAL_COMMUNITY): Payer: Medicare PPO

## 2023-07-17 DIAGNOSIS — R109 Unspecified abdominal pain: Secondary | ICD-10-CM | POA: Insufficient documentation

## 2023-07-17 DIAGNOSIS — N390 Urinary tract infection, site not specified: Secondary | ICD-10-CM | POA: Diagnosis not present

## 2023-07-17 DIAGNOSIS — R9389 Abnormal findings on diagnostic imaging of other specified body structures: Secondary | ICD-10-CM | POA: Diagnosis not present

## 2023-07-17 DIAGNOSIS — R410 Disorientation, unspecified: Secondary | ICD-10-CM | POA: Insufficient documentation

## 2023-07-17 DIAGNOSIS — F0280 Dementia in other diseases classified elsewhere without behavioral disturbance: Secondary | ICD-10-CM | POA: Diagnosis not present

## 2023-07-17 DIAGNOSIS — R509 Fever, unspecified: Secondary | ICD-10-CM | POA: Insufficient documentation

## 2023-07-17 DIAGNOSIS — Z7982 Long term (current) use of aspirin: Secondary | ICD-10-CM | POA: Diagnosis not present

## 2023-07-17 DIAGNOSIS — Z1152 Encounter for screening for COVID-19: Secondary | ICD-10-CM | POA: Insufficient documentation

## 2023-07-17 DIAGNOSIS — R531 Weakness: Secondary | ICD-10-CM | POA: Diagnosis not present

## 2023-07-17 DIAGNOSIS — I10 Essential (primary) hypertension: Secondary | ICD-10-CM | POA: Diagnosis not present

## 2023-07-17 DIAGNOSIS — R0689 Other abnormalities of breathing: Secondary | ICD-10-CM | POA: Diagnosis not present

## 2023-07-17 DIAGNOSIS — K449 Diaphragmatic hernia without obstruction or gangrene: Secondary | ICD-10-CM | POA: Diagnosis not present

## 2023-07-17 DIAGNOSIS — G9340 Encephalopathy, unspecified: Secondary | ICD-10-CM | POA: Diagnosis not present

## 2023-07-17 DIAGNOSIS — R4182 Altered mental status, unspecified: Secondary | ICD-10-CM | POA: Diagnosis not present

## 2023-07-17 DIAGNOSIS — R0989 Other specified symptoms and signs involving the circulatory and respiratory systems: Secondary | ICD-10-CM | POA: Diagnosis not present

## 2023-07-17 DIAGNOSIS — F0390 Unspecified dementia without behavioral disturbance: Secondary | ICD-10-CM | POA: Diagnosis not present

## 2023-07-17 DIAGNOSIS — R9082 White matter disease, unspecified: Secondary | ICD-10-CM | POA: Diagnosis not present

## 2023-07-17 DIAGNOSIS — K5730 Diverticulosis of large intestine without perforation or abscess without bleeding: Secondary | ICD-10-CM | POA: Diagnosis not present

## 2023-07-17 LAB — CBC WITH DIFFERENTIAL/PLATELET
Abs Immature Granulocytes: 0.03 10*3/uL (ref 0.00–0.07)
Basophils Absolute: 0 10*3/uL (ref 0.0–0.1)
Basophils Relative: 0 %
Eosinophils Absolute: 0 10*3/uL (ref 0.0–0.5)
Eosinophils Relative: 0 %
HCT: 43.6 % (ref 36.0–46.0)
Hemoglobin: 14.2 g/dL (ref 12.0–15.0)
Immature Granulocytes: 0 %
Lymphocytes Relative: 3 %
Lymphs Abs: 0.2 10*3/uL — ABNORMAL LOW (ref 0.7–4.0)
MCH: 27.9 pg (ref 26.0–34.0)
MCHC: 32.6 g/dL (ref 30.0–36.0)
MCV: 85.7 fL (ref 80.0–100.0)
Monocytes Absolute: 0.4 10*3/uL (ref 0.1–1.0)
Monocytes Relative: 5 %
Neutro Abs: 7 10*3/uL (ref 1.7–7.7)
Neutrophils Relative %: 92 %
Platelets: 289 10*3/uL (ref 150–400)
RBC: 5.09 MIL/uL (ref 3.87–5.11)
RDW: 14.1 % (ref 11.5–15.5)
WBC: 7.7 10*3/uL (ref 4.0–10.5)
nRBC: 0 % (ref 0.0–0.2)

## 2023-07-17 LAB — COMPREHENSIVE METABOLIC PANEL
ALT: 14 U/L (ref 0–44)
AST: 18 U/L (ref 15–41)
Albumin: 3.7 g/dL (ref 3.5–5.0)
Alkaline Phosphatase: 92 U/L (ref 38–126)
Anion gap: 13 (ref 5–15)
BUN: 10 mg/dL (ref 8–23)
CO2: 22 mmol/L (ref 22–32)
Calcium: 8.6 mg/dL — ABNORMAL LOW (ref 8.9–10.3)
Chloride: 101 mmol/L (ref 98–111)
Creatinine, Ser: 0.78 mg/dL (ref 0.44–1.00)
GFR, Estimated: >60 mL/min (ref >60)
Glucose, Bld: 103 mg/dL — ABNORMAL HIGH (ref 70–99)
Potassium: 3.6 mmol/L (ref 3.5–5.1)
Sodium: 136 mmol/L (ref 135–145)
Total Bilirubin: 1.5 mg/dL — ABNORMAL HIGH (ref 0.3–1.2)
Total Protein: 7.7 g/dL (ref 6.5–8.1)

## 2023-07-17 LAB — RESP PANEL BY RT-PCR (RSV, FLU A&B, COVID)  RVPGX2
Influenza A by PCR: NEGATIVE
Influenza B by PCR: NEGATIVE
Resp Syncytial Virus by PCR: NEGATIVE
SARS Coronavirus 2 by RT PCR: NEGATIVE

## 2023-07-17 LAB — URINALYSIS, W/ REFLEX TO CULTURE (INFECTION SUSPECTED)
Bacteria, UA: NONE SEEN
Bilirubin Urine: NEGATIVE
Glucose, UA: NEGATIVE mg/dL
Hgb urine dipstick: NEGATIVE
Ketones, ur: NEGATIVE mg/dL
Leukocytes,Ua: NEGATIVE
Nitrite: NEGATIVE
Protein, ur: NEGATIVE mg/dL
Specific Gravity, Urine: 1.019 (ref 1.005–1.030)
pH: 7 (ref 5.0–8.0)

## 2023-07-17 LAB — I-STAT CG4 LACTIC ACID, ED: Lactic Acid, Venous: 1.9 mmol/L (ref 0.5–1.9)

## 2023-07-17 LAB — LIPASE, BLOOD: Lipase: 29 U/L (ref 11–51)

## 2023-07-17 LAB — CBG MONITORING, ED: Glucose-Capillary: 109 mg/dL — ABNORMAL HIGH (ref 70–99)

## 2023-07-17 MED ORDER — ACETAMINOPHEN 325 MG PO TABS
650.0000 mg | ORAL_TABLET | Freq: Once | ORAL | Status: AC
  Administered 2023-07-17: 650 mg via ORAL
  Filled 2023-07-17: qty 2

## 2023-07-17 MED ORDER — IOHEXOL 300 MG/ML  SOLN
100.0000 mL | Freq: Once | INTRAMUSCULAR | Status: AC | PRN
  Administered 2023-07-17: 100 mL via INTRAVENOUS

## 2023-07-17 MED ORDER — SODIUM CHLORIDE 0.9 % IV SOLN
1.0000 g | Freq: Once | INTRAVENOUS | Status: AC
  Administered 2023-07-17: 1 g via INTRAVENOUS
  Filled 2023-07-17: qty 10

## 2023-07-17 NOTE — ED Triage Notes (Signed)
Pt BIB EMS from pcp c/o weakness, fatigue, increased confusion. Hx dementia Pt was started on antibiotic yesterday for poss uti

## 2023-07-17 NOTE — ED Provider Notes (Signed)
  Physical Exam  BP (!) 158/114   Pulse 89   Temp 98.5 F (36.9 C)   Resp 18   Ht 4\' 10"  (1.473 m)   Wt 82.7 kg   SpO2 100%   BMI 38.11 kg/m   Physical Exam  Procedures  Procedures  ED Course / MDM   Clinical Course as of 07/17/23 2024  Thu Jul 17, 2023  1651 WBC: 7.7 [TY]  1651 Lactic Acid, Venous: 1.9 [TY]  1651 Comprehensive metabolic panel(!) Significant metabolic derangements.  No transaminitis to suggest hepatobiliary disease. [TY]  1652 Lipase: 29 Pancreatitis less likely. [TY]    Clinical Course User Index [TY] Coral Spikes, DO   Medical Decision Making Care assumed at 5 PM.  Patient is here with weakness and confusion.  Patient is febrile 103 in the ED.  Signout pending labs and urinalysis and CT head and CT abdomen pelvis.   8:25 PM Reviewed patient's labs and COVID test is negative.  Patient's white blood cell count is normal and lactate is normal.  Patient urinalysis is normal.  Patient is already on Macrobid.  Patient is no longer febrile.  CT head is unremarkable and CT abdomen pelvis showed no bowel obstruction and moderate stool in the rectum.  Patient has no stool impaction on rectal exam.  Patient also has been having loose stools.  I discussed results with her sister and niece who are at bedside.  They live with him and she has an aide that comes in to help her.  We discussed admission versus home with PT. Since she is on Macrobid and has care at home, I think it is appropriate to discharge her home.  Patient is also back to baseline now.  Problems Addressed: Confusion: acute illness or injury Fever, unspecified fever cause: acute illness or injury  Amount and/or Complexity of Data Reviewed Labs: ordered. Decision-making details documented in ED Course. Radiology: ordered and independent interpretation performed. Decision-making details documented in ED Course. ECG/medicine tests: ordered.  Risk OTC drugs. Prescription drug  management.          Charlynne Pander, MD 07/17/23 2027

## 2023-07-17 NOTE — Discharge Instructions (Addendum)
As we discussed, you received antibiotics for UTI.  I recommend finishing your Macrobid as prescribed by your doctor  Take Tylenol or Motrin for fever  I have added home health with physical therapy and social work to help take care of you at home  Follow-up with your doctor  Return to ER if you have worse confusion, fever, abdominal pain

## 2023-07-17 NOTE — ED Provider Notes (Signed)
[place] EMERGENCY DEPARTMENT AT [facility] Provider Note   CSN: [id number] Arrival date & time: 07/17/23  1433     History  Chief Complaint  Patient presents with   Weakness    Sheila Logan is a 82 y.o. female.   This is an 82 year old female presented to the emergency department for altered mental status.  Reportedly history of dementia, and is more confused than her baseline with generalized weakness with inability to stand per PCP note sent with patient.  Reportedly treated for UTI the past couple days.  Patient also had episode of vomiting earlier today and has not eaten or drink.   Weakness      Home Medications Prior to Admission medications   Medication Sig Start Date End Date Taking? Authorizing Provider  Apoaequorin (PREVAGEN) 10 MG CAPS Take by mouth. Patient not taking: Reported on 12/25/2021    [provider]  aspirin 81 MG tablet Take 81 mg by mouth daily. Patient not taking: Reported on 12/25/2021    [provider]  co-enzyme Q-10 30 MG capsule Take 30 mg by mouth 3 (three) times daily. Patient not taking: Reported on 12/25/2021    [provider]  donepezil (ARICEPT) 10 MG tablet Take 10 mg by mouth at bedtime.  07/31/18   [provider]  losartan-hydrochlorothiazide (HYZAAR) 50-12.5 MG tablet Take 1 tablet by mouth daily.  07/06/18   [provider]  memantine (NAMENDA) 10 MG tablet Take 1 tablet (10 mg total) by mouth 2 (two) times daily. 02/21/22   Penumalli, Glenford Bayley, MD  omeprazole (PRILOSEC) 20 MG capsule Take 1 capsule (20 mg total) by mouth daily. 30 minutes before breakfast Patient not taking: Reported on 12/25/2021 04/04/17   Lyndal Pulley, MD  ondansetron (ZOFRAN) 4 MG tablet Take 4 mg by mouth every 8 (eight) hours as needed for nausea or vomiting. Patient not taking: Reported on 12/25/2021    [provider]  potassium chloride SA (K-DUR,KLOR-CON) 20 MEQ tablet Take 20 mEq by mouth  daily. 03/18/17   [provider]  rosuvastatin (CRESTOR) 5 MG tablet Take 5 mg by mouth daily. Patient not taking: Reported on 12/25/2021    [provider]  simvastatin (ZOCOR) 20 MG tablet Take 20 mg by mouth daily. Patient not taking: Reported on 12/25/2021 05/09/18   [provider]      Allergies    Patient has no known allergies.    Review of Systems   Review of Systems  Neurological:  Positive for weakness.    Physical Exam Updated Vital Signs BP (!) 154/92 (BP Location: Left Arm)   Pulse 97   Temp (!) 103 F (39.4 C) (Oral)   Resp 18   Ht 4\' 10"  (1.473 m)   Wt 82.7 kg   SpO2 100%   BMI 38.11 kg/m  Physical Exam Vitals and nursing note reviewed.  Constitutional:      General: She is not in acute distress.    Appearance: She is not toxic-appearing.  HENT:     Head: Normocephalic and atraumatic.     Nose: Nose normal.     Mouth/Throat:     Mouth: Mucous membranes are moist.  Eyes:     Conjunctiva/sclera: Conjunctivae normal.  Cardiovascular:     Rate and Rhythm: Normal rate and regular rhythm.     Pulses: Normal pulses.  Pulmonary:     Effort: Pulmonary effort is normal.     Breath sounds: Normal breath sounds.  Abdominal:     General: Abdomen is flat. There is no distension.     Palpations: Abdomen is soft.     Tenderness: There is abdominal tenderness (mild global). There is no guarding or rebound.  Musculoskeletal:     Right lower leg: No edema.     Left lower leg: No edema.  Skin:    General: Skin is warm and dry.  Neurological:     Mental Status: She is alert.     Comments: Exam limited by patient's participation/mentation.  Does have some global weakness, but no localizing deficits.  Movements appear coordinated.     ED Results / Procedures / Treatments   Labs (all labs ordered are listed, but only abnormal results are displayed) Labs Reviewed  COMPREHENSIVE METABOLIC PANEL - Abnormal; Notable for the following  components:      Result Value   Glucose, Bld 103 (*)    Calcium 8.6 (*)    Total Bilirubin 1.5 (*)    All other components within normal limits  CBC WITH DIFFERENTIAL/PLATELET - Abnormal; Notable for the following components:   Lymphs Abs 0.2 (*)    All other components within normal limits  CBG MONITORING, ED - Abnormal; Notable for the following components:   Glucose-Capillary 109 (*)    All other components within normal limits  CULTURE, BLOOD (ROUTINE X 2)  CULTURE, BLOOD (ROUTINE X 2)  RESP PANEL BY RT-PCR (RSV, FLU A&B, COVID)  RVPGX2  LIPASE, BLOOD  URINALYSIS, W/ REFLEX TO CULTURE (INFECTION SUSPECTED)  I-STAT CG4 LACTIC ACID, ED  I-STAT CG4 LACTIC ACID, ED    EKG None  Radiology DG Chest Portable 1 View  Result Date: 07/17/2023 CLINICAL DATA:  AMS EXAM: PORTABLE CHEST 1 VIEW COMPARISON:  03/12/2019. FINDINGS: Low lung volume. Bilateral lung fields are clear. Note is again made of elevated right hemidiaphragm. Bilateral costophrenic angles are clear. Normal cardio-mediastinal silhouette, considering the AP technique. No acute osseous abnormalities. The soft tissues are within normal limits. IMPRESSION: No active disease. Electronically Signed   By: Jules Schick M.D.   On: 07/17/2023 15:58    Procedures Procedures    Medications Ordered in ED Medications  cefTRIAXone (ROCEPHIN) 1 g in sodium chloride 0.9 % 100 mL IVPB (has no administration in time range)  acetaminophen (TYLENOL) tablet 650 mg (650 mg Oral Given 07/17/23 1645)    ED Course/ Medical Decision Making/ A&P Clinical Course as of 07/17/23 1707  Thu Jul 17, 2023  1651 WBC: 7.7 [TY]  1651 Lactic Acid, Venous: 1.9 [TY]  1651 Comprehensive metabolic panel(!) Significant metabolic derangements.  No transaminitis to suggest hepatobiliary disease. [TY]  1652 Lipase: 29 Pancreatitis less likely. [TY]    Clinical Course User Index [TY] Coral Spikes, DO                             Medical Decision  Making Is a 82 year old female present emergency department for altered mental status thought to be secondary to UTI.  She is febrile.  But nontachycardic and respiratory rate 18.  Workup with no leukocytosis, normal lactate.  Reportedly altered, CT head pending, she did have some mild abdominal discomfort on palpitation.  But soft nonsurgical abdomen.  CT abdomen given patient's inability provide detailed history.  UA pending.  Care signed out to afternoon team; likely admit for AMS 2/2 UTI.   Amount and/or Complexity of Data Reviewed External Data Reviewed: notes.    Details:  Patient presented with paperwork from PCP office which reported decreased p.o. intake today with nausea/vomiting today and the patient is more confused than normal. Labs: ordered. Decision-making details documented in ED Course. Radiology: ordered. Decision-making details documented in ED Course. ECG/medicine tests: ordered.  Risk OTC drugs.         Final Clinical Impression(s) / ED Diagnoses Final diagnoses:  None    Rx / DC Orders ED Discharge Orders     None         Coral Spikes, DO 07/17/23 1707

## 2023-07-18 DIAGNOSIS — R509 Fever, unspecified: Secondary | ICD-10-CM | POA: Diagnosis not present

## 2023-07-31 ENCOUNTER — Ambulatory Visit: Payer: Medicare PPO | Admitting: Family

## 2023-08-20 ENCOUNTER — Other Ambulatory Visit (HOSPITAL_BASED_OUTPATIENT_CLINIC_OR_DEPARTMENT_OTHER): Payer: Self-pay

## 2023-08-20 ENCOUNTER — Other Ambulatory Visit: Payer: Self-pay

## 2023-08-20 ENCOUNTER — Other Ambulatory Visit (HOSPITAL_COMMUNITY): Payer: Self-pay

## 2023-08-20 MED ORDER — ATORVASTATIN CALCIUM 10 MG PO TABS
10.0000 mg | ORAL_TABLET | Freq: Every evening | ORAL | 5 refills | Status: DC
  Filled 2023-08-20: qty 30, 30d supply, fill #0

## 2023-08-20 MED ORDER — POTASSIUM CHLORIDE CRYS ER 20 MEQ PO TBCR
20.0000 meq | EXTENDED_RELEASE_TABLET | Freq: Every day | ORAL | 5 refills | Status: DC
  Filled 2023-08-20: qty 30, 30d supply, fill #0

## 2023-08-20 MED ORDER — DONEPEZIL HCL 10 MG PO TABS
10.0000 mg | ORAL_TABLET | Freq: Every morning | ORAL | 12 refills | Status: DC
  Filled 2023-08-20: qty 30, 30d supply, fill #0

## 2023-08-20 MED ORDER — LOSARTAN POTASSIUM-HCTZ 50-12.5 MG PO TABS
1.0000 | ORAL_TABLET | Freq: Every day | ORAL | 5 refills | Status: DC
  Filled 2023-08-20: qty 30, 30d supply, fill #0

## 2023-09-04 ENCOUNTER — Encounter: Payer: Self-pay | Admitting: Family

## 2023-09-04 ENCOUNTER — Other Ambulatory Visit (HOSPITAL_COMMUNITY): Payer: Self-pay

## 2023-09-04 ENCOUNTER — Other Ambulatory Visit: Payer: Self-pay

## 2023-09-04 ENCOUNTER — Ambulatory Visit: Payer: Medicare PPO | Admitting: Family

## 2023-09-04 VITALS — BP 110/80 | HR 74 | Temp 97.6°F | Resp 14 | Ht 59.0 in | Wt 188.0 lb

## 2023-09-04 DIAGNOSIS — I10 Essential (primary) hypertension: Secondary | ICD-10-CM | POA: Diagnosis not present

## 2023-09-04 DIAGNOSIS — I5032 Chronic diastolic (congestive) heart failure: Secondary | ICD-10-CM

## 2023-09-04 DIAGNOSIS — Z23 Encounter for immunization: Secondary | ICD-10-CM | POA: Diagnosis not present

## 2023-09-04 DIAGNOSIS — E785 Hyperlipidemia, unspecified: Secondary | ICD-10-CM

## 2023-09-04 DIAGNOSIS — F03918 Unspecified dementia, unspecified severity, with other behavioral disturbance: Secondary | ICD-10-CM | POA: Diagnosis not present

## 2023-09-04 DIAGNOSIS — R32 Unspecified urinary incontinence: Secondary | ICD-10-CM | POA: Diagnosis not present

## 2023-09-04 MED ORDER — POTASSIUM CHLORIDE CRYS ER 20 MEQ PO TBCR
20.0000 meq | EXTENDED_RELEASE_TABLET | Freq: Every day | ORAL | 5 refills | Status: DC
Start: 2023-09-04 — End: 2024-02-04
  Filled 2023-09-04: qty 30, 30d supply, fill #0
  Filled 2023-10-01: qty 30, 30d supply, fill #1
  Filled 2023-11-04: qty 30, 30d supply, fill #2
  Filled 2023-11-30 – 2023-12-02 (×2): qty 30, 30d supply, fill #3
  Filled 2023-12-16 – 2023-12-25 (×2): qty 30, 30d supply, fill #4
  Filled 2024-01-19 (×2): qty 30, 30d supply, fill #5

## 2023-09-04 MED ORDER — DONEPEZIL HCL 10 MG PO TABS
10.0000 mg | ORAL_TABLET | Freq: Every morning | ORAL | 12 refills | Status: DC
Start: 2023-09-04 — End: 2024-09-14
  Filled 2023-09-04: qty 30, 30d supply, fill #0
  Filled 2023-10-01: qty 30, 30d supply, fill #1
  Filled 2023-11-04: qty 30, 30d supply, fill #2
  Filled 2023-11-30 – 2023-12-02 (×2): qty 30, 30d supply, fill #3
  Filled 2023-12-16 – 2023-12-25 (×2): qty 30, 30d supply, fill #4
  Filled 2024-01-19 (×2): qty 30, 30d supply, fill #5
  Filled 2024-02-04 – 2024-02-11 (×3): qty 30, 30d supply, fill #6
  Filled 2024-02-16 – 2024-03-09 (×2): qty 30, 30d supply, fill #7
  Filled 2024-03-29 – 2024-04-01 (×2): qty 30, 30d supply, fill #8
  Filled 2024-04-14 – 2024-04-27 (×2): qty 30, 30d supply, fill #9
  Filled 2024-05-12 – 2024-06-04 (×3): qty 30, 30d supply, fill #10
  Filled 2024-07-13 – 2024-07-26 (×2): qty 30, 30d supply, fill #11
  Filled 2024-08-24: qty 30, 30d supply, fill #12

## 2023-09-04 MED ORDER — ATORVASTATIN CALCIUM 10 MG PO TABS
10.0000 mg | ORAL_TABLET | Freq: Every evening | ORAL | 5 refills | Status: DC
Start: 2023-09-04 — End: 2024-02-02
  Filled 2023-09-04: qty 30, 30d supply, fill #0
  Filled 2023-10-01: qty 30, 30d supply, fill #1
  Filled 2023-11-04: qty 30, 30d supply, fill #2
  Filled 2023-11-30 – 2023-12-02 (×2): qty 30, 30d supply, fill #3
  Filled 2023-12-16 – 2023-12-25 (×2): qty 30, 30d supply, fill #4
  Filled 2024-01-19 (×2): qty 30, 30d supply, fill #5

## 2023-09-04 MED ORDER — LOSARTAN POTASSIUM-HCTZ 50-12.5 MG PO TABS
1.0000 | ORAL_TABLET | Freq: Every day | ORAL | 5 refills | Status: DC
Start: 2023-09-04 — End: 2024-02-02
  Filled 2023-09-04: qty 30, 30d supply, fill #0
  Filled 2023-10-01: qty 30, 30d supply, fill #1
  Filled 2023-11-04: qty 30, 30d supply, fill #2
  Filled 2023-11-30 – 2023-12-02 (×2): qty 30, 30d supply, fill #3
  Filled 2023-12-16 – 2023-12-25 (×2): qty 30, 30d supply, fill #4
  Filled 2024-01-19 (×2): qty 30, 30d supply, fill #5

## 2023-09-04 MED ORDER — DIVALPROEX SODIUM 125 MG PO DR TAB
125.0000 mg | DELAYED_RELEASE_TABLET | Freq: Every day | ORAL | 0 refills | Status: DC
Start: 2023-09-04 — End: 2023-10-01
  Filled 2023-09-04: qty 30, 30d supply, fill #0

## 2023-09-04 NOTE — Progress Notes (Addendum)
Provider: Richarda Blade FNP-C   Kelechi Orgeron, Donalee Citrin, NP  Patient Care Team: Shelly Shoultz, Donalee Citrin, NP as PCP - General (Family Medicine)  Extended Emergency Contact Information Primary Emergency Contact: Clois Comber of [place] Home Phone: [phone] Relation: Niece Secondary Emergency Contact: Goins,Raven Mobile Phone: [phone] Relation: Niece  Code Status:  Full code  Goals of care: Advanced Directive information    09/04/2023    1:01 PM  Advanced Directives  Does Patient Have a Medical Advance Directive? Yes  Type of Estate agent of [place];Living will  Does patient want to make changes to medical advance directive? Yes (Inpatient - patient defers changing a medical advance directive at this time - Information given)  Copy of Healthcare Power of Attorney in Chart? No - copy requested     Chief Complaint  Patient presents with   Establish Care    Discuss changes in behavior - wants to stay in bed longer, frequent UTI's, threatening to hit people, not eating, hard to get to do ADL's, mood swings    HPI:  Pt is a 82 y.o. female seen today establish care here at [facility] for medical management of chronic diseases.Has medical history Hypertension,Hyperlipidemia,Osteoarthritis ,Dementia with behavioral disturbances among others.she is here with her sister and Niece.   Hypertension - does not check B/p home.she denies any headache,dizziness,vision changes,fatigue,chest tightness,palpitation,chest pain or shortness of breath.     Dementia - has had progressive decline starting from the beginning of this year.Has been easily agitated and resisting care.Has care giver who assist with her ADL's. No weight loss reported.Has good appetite.No wondering or seeking exist or sundowning.    Past Medical History:  Diagnosis Date   Arthritis    Arthritis    Chronic pain    Dementia (HCC)    Dyslipidemia    Essential  hypertension    Estrogen deficiency    Gait instability    Hypertension    LTBI (latent tuberculosis infection)    Rotator cuff disorder    right   Uveitis    Past Surgical History:  Procedure Laterality Date   ABDOMINAL HYSTERECTOMY     total   WRIST SURGERY Right    carpel tunnel yrs ago    No Known Allergies  Allergies as of 09/04/2023   No Known Allergies      Medication List        Accurate as of September 04, 2023  1:15 PM. If you have any questions, ask your nurse or doctor.          STOP taking these medications    co-enzyme Q-10 30 MG capsule Stopped by: Dainelle Hun C Cindy Brindisi   memantine 10 MG tablet Commonly known as: NAMENDA Stopped by: Donalee Citrin Iver Miklas   omeprazole 20 MG capsule Commonly known as: PRILOSEC Stopped by: Donalee Citrin Leverett Camplin   Prevagen 10 MG Caps Generic drug: Apoaequorin Stopped by: Donalee Citrin Tamyrah Burbage   rosuvastatin 5 MG tablet Commonly known as: CRESTOR Stopped by: Donalee Citrin Delmo Matty   simvastatin 20 MG tablet Commonly known as: ZOCOR Stopped by: Donalee Citrin Zimir Kittleson       TAKE these medications    aspirin 81 MG tablet Take 81 mg by mouth daily.   atorvastatin 10 MG tablet Commonly known as: LIPITOR Take 1 tablet (10 mg total) by mouth at bedtime.   donepezil 10 MG tablet Commonly known as: ARICEPT Take 1 tablet (10 mg total) by mouth every morning. What  changed: Another medication with the same name was removed. Continue taking this medication, and follow the directions you see here. Changed by: Donalee Citrin Sahirah Rudell   losartan-hydrochlorothiazide 50-12.5 MG tablet Commonly known as: HYZAAR Take 1 tablet by mouth daily. What changed: Another medication with the same name was removed. Continue taking this medication, and follow the directions you see here. Changed by: Donalee Citrin Lavaun Greenfield   ondansetron 4 MG tablet Commonly known as: ZOFRAN Take 4 mg by mouth every 8 (eight) hours as needed for nausea or vomiting.   potassium chloride SA  20 MEQ tablet Commonly known as: KLOR-CON M Take 1 tablet (20 mEq total) by mouth daily with food. What changed: Another medication with the same name was removed. Continue taking this medication, and follow the directions you see here. Changed by: Donalee Citrin Lanayah Gartley        Review of Systems  Constitutional:  Negative for appetite change, chills, fatigue, fever and unexpected weight change.  HENT:  Negative for congestion, dental problem, ear discharge, ear pain, facial swelling, hearing loss, nosebleeds, postnasal drip, rhinorrhea, sinus pressure, sinus pain, sneezing, sore throat, tinnitus and trouble swallowing.   Eyes:  Negative for pain, discharge, redness, itching and visual disturbance.  Respiratory:  Negative for cough, chest tightness, shortness of breath and wheezing.   Cardiovascular:  Negative for chest pain, palpitations and leg swelling.  Gastrointestinal:  Negative for abdominal distention, abdominal pain, blood in stool, constipation, diarrhea, nausea and vomiting.  Endocrine: Negative for cold intolerance, heat intolerance, polydipsia, polyphagia and polyuria.  Genitourinary:  Negative for difficulty urinating, dysuria, flank pain, frequency and urgency.  Musculoskeletal:  Negative for arthralgias, back pain, gait problem, joint swelling, myalgias, neck pain and neck stiffness.  Skin:  Negative for color change, pallor, rash and wound.  Neurological:  Negative for dizziness, syncope, speech difficulty, weakness, light-headedness, numbness and headaches.  Hematological:  Does not bruise/bleed easily.  Psychiatric/Behavioral:  Positive for agitation. Negative for behavioral problems, confusion, hallucinations, self-injury, sleep disturbance and suicidal ideas. The patient is not nervous/anxious.        Agitation    Immunization History  Administered Date(s) Administered   Influenza-Unspecified 01/31/2015   Pneumococcal-Unspecified 12/23/2013   Pertinent  Health Maintenance  Due  Topic Date Due   INFLUENZA VACCINE  07/24/2023   DEXA SCAN  Completed      01/31/2015    4:28 PM 05/21/2017   10:10 AM 09/04/2023    1:01 PM  Fall Risk  Falls in the past year? Yes Yes 0  Was there an injury with Fall? No Yes   Was there an injury with Fall? - Comments  hit head- "knot still there"   Patient at Risk for Falls Due to History of fall(s) Impaired balance/gait    Functional Status Survey:    Vitals:   09/04/23 1305  BP: 110/80  Pulse: 74  Resp: 14  Temp: 97.6 F (36.4 C)  SpO2: 98%  Weight: 188 lb (85.3 kg)  Height: 4\' 11"  (1.499 m)   Body mass index is 37.97 kg/m. Physical Exam Vitals reviewed.  Constitutional:      General: She is not in acute distress.    Appearance: Normal appearance. She is obese. She is not ill-appearing or diaphoretic.  HENT:     Head: Normocephalic.     Right Ear: Tympanic membrane, ear canal and external ear normal. There is no impacted cerumen.     Left Ear: Tympanic membrane, ear canal and external ear normal. There  is no impacted cerumen.     Nose: Nose normal. No congestion or rhinorrhea.     Mouth/Throat:     Mouth: Mucous membranes are moist.     Pharynx: Oropharynx is clear. No oropharyngeal exudate or posterior oropharyngeal erythema.  Eyes:     General: No scleral icterus.       Right eye: No discharge.        Left eye: No discharge.     Extraocular Movements: Extraocular movements intact.     Conjunctiva/sclera: Conjunctivae normal.     Pupils: Pupils are equal, round, and reactive to light.  Neck:     Vascular: No carotid bruit.  Cardiovascular:     Rate and Rhythm: Normal rate and regular rhythm.     Pulses: Normal pulses.     Heart sounds: Normal heart sounds. No murmur heard.    No friction rub. No gallop.  Pulmonary:     Effort: Pulmonary effort is normal. No respiratory distress.     Breath sounds: Normal breath sounds. No wheezing, rhonchi or rales.  Chest:     Chest wall: No tenderness.   Abdominal:     General: Bowel sounds are normal. There is no distension.     Palpations: Abdomen is soft. There is no mass.     Tenderness: There is no abdominal tenderness. There is no right CVA tenderness, left CVA tenderness, guarding or rebound.  Musculoskeletal:        General: No swelling or tenderness. Normal range of motion.     Cervical back: Normal range of motion. No rigidity or tenderness.     Right lower leg: No edema.     Left lower leg: No edema.  Lymphadenopathy:     Cervical: No cervical adenopathy.  Skin:    General: Skin is warm and dry.     Coloration: Skin is not pale.     Findings: No bruising, erythema, lesion or rash.  Neurological:     Mental Status: She is alert and oriented to person, place, and time.     Cranial Nerves: No cranial nerve deficit.     Sensory: No sensory deficit.     Motor: No weakness.     Coordination: Coordination normal.     Gait: Gait normal.  Psychiatric:        Mood and Affect: Mood normal.        Speech: Speech normal.        Behavior: Behavior is agitated.        Thought Content: Thought content normal.        Cognition and Memory: Cognition is impaired. Memory is impaired. She exhibits impaired recent memory.        Judgment: Judgment normal.     Comments: Scored 17/30 on MMSE today      Labs reviewed: Recent Labs    07/17/23 1526  NA 136  K 3.6  CL 101  CO2 22  GLUCOSE 103*  BUN 10  CREATININE 0.78  CALCIUM 8.6*   Recent Labs    07/17/23 1526  AST 18  ALT 14  ALKPHOS 92  BILITOT 1.5*  PROT 7.7  ALBUMIN 3.7   Recent Labs    07/17/23 1526  WBC 7.7  NEUTROABS 7.0  HGB 14.2  HCT 43.6  MCV 85.7  PLT 289   Lab Results  Component Value Date   TSH 0.844 04/05/2017   Lab Results  Component Value Date   HGBA1C 5.3 04/05/2017   No  results found for: "CHOL", "HDL", "LDLCALC", "LDLDIRECT", "TRIG", "CHOLHDL"  Significant Diagnostic Results in last 30 days:  No results found.  Assessment/Plan 1.  Need for influenza vaccination Afebrile  Flut shot administered by CMA no acute reaction reported.  - Flu Vaccine Trivalent High Dose (Fluad)  2. Essential hypertension Blood pressure well-controlled -Continue on losartan-hydrochlorothiazide - losartan-hydrochlorothiazide (HYZAAR) 50-12.5 MG tablet; Take 1 tablet by mouth daily.  Dispense: 30 tablet; Refill: 5 - potassium chloride SA (KLOR-CON M) 20 MEQ tablet; Take 1 tablet (20 mEq total) by mouth daily with food.  Dispense: 30 tablet; Refill: 5 - TSH - COMPLETE METABOLIC PANEL WITH GFR - CBC with Differential/Platelet  3. Hyperlipidemia LDL goal <100 No recent LDL for review -Continue dietary modification and exercise as tolerated -Continue on atorvastatin - atorvastatin (LIPITOR) 10 MG tablet; Take 1 tablet (10 mg total) by mouth at bedtime.  Dispense: 30 tablet; Refill: 5 - Lipid panel  4. Chronic diastolic CHF (congestive heart failure) (HCC) No signs of fluid overload  - Keep legs elevated when seated to keep swelling down  - check weight at least three times per week and notify provider for any abrupt weight gain > 3 lbs  - Reduce salt intake in diet  -Continue on hydrochlorothiazide  5. Dementia with behavioral disturbance (HCC) Progressive decline with a sleep agitation and resistive to care towards caregivers. - Scored 17/30 on MMSE today  -Discussed starting on mood stabilizer system and is in agreement. Start on by Depakote 125 mg tablet once daily and follow-up in 1 month to reevaluate.  Will increase to 250 mg daily at bedtime if still symptoms not controlled -Continue on donepezil - donepezil (ARICEPT) 10 MG tablet; Take 1 tablet (10 mg total) by mouth every morning.  Dispense: 30 tablet; Refill: 12 - divalproex (DEPAKOTE) 125 MG DR tablet; Take 1 tablet (125 mg total) by mouth daily.  Dispense: 30 tablet; Refill: 0  6. Urinary incontinence, unspecified type Unable to provide urine specimen during visit.  Will have  POA collect urine specimen at home then return specimen to the lab -Encourage rehydration - Urine Culture; Future  Family/ staff Communication: Reviewed plan of care with patient, niece and sister verbalized understanding  Labs/tests ordered:  - TSH - COMPLETE METABOLIC PANEL WITH GFR - CBC with Differential/Platelet - Urine Culture; Future  Next Appointment : Return in about 1 month (around 10/04/2023) for dementia .   Caesar Bookman, NP

## 2023-09-05 ENCOUNTER — Other Ambulatory Visit (HOSPITAL_COMMUNITY): Payer: Self-pay

## 2023-09-05 LAB — COMPLETE METABOLIC PANEL WITH GFR
AG Ratio: 1.2 (calc) (ref 1.0–2.5)
ALT: 12 U/L (ref 6–29)
AST: 13 U/L (ref 10–35)
Albumin: 3.7 g/dL (ref 3.6–5.1)
Alkaline phosphatase (APISO): 93 U/L (ref 37–153)
BUN: 7 mg/dL (ref 7–25)
CO2: 27 mmol/L (ref 20–32)
Calcium: 8.7 mg/dL (ref 8.6–10.4)
Chloride: 102 mmol/L (ref 98–110)
Creat: 0.89 mg/dL (ref 0.60–0.95)
Globulin: 3 g/dL (ref 1.9–3.7)
Glucose, Bld: 82 mg/dL (ref 65–139)
Potassium: 3.6 mmol/L (ref 3.5–5.3)
Sodium: 140 mmol/L (ref 135–146)
Total Bilirubin: 0.8 mg/dL (ref 0.2–1.2)
Total Protein: 6.7 g/dL (ref 6.1–8.1)
eGFR: 65 mL/min/{1.73_m2} (ref >=60)

## 2023-09-05 LAB — CBC WITH DIFFERENTIAL/PLATELET
Absolute Monocytes: 643 {cells}/uL (ref 200–950)
Basophils Absolute: 40 {cells}/uL (ref 0–200)
Basophils Relative: 0.6 %
Eosinophils Absolute: 221 {cells}/uL (ref 15–500)
Eosinophils Relative: 3.3 %
HCT: 41.4 % (ref 35.0–45.0)
Hemoglobin: 13.5 g/dL (ref 11.7–15.5)
Lymphs Abs: 2553 {cells}/uL (ref 850–3900)
MCH: 27.9 pg (ref 27.0–33.0)
MCHC: 32.6 g/dL (ref 32.0–36.0)
MCV: 85.5 fL (ref 80.0–100.0)
MPV: 10.8 fL (ref 7.5–12.5)
Monocytes Relative: 9.6 %
Neutro Abs: 3243 {cells}/uL (ref 1500–7800)
Neutrophils Relative %: 48.4 %
Platelets: 288 10*3/uL (ref 140–400)
RBC: 4.84 10*6/uL (ref 3.80–5.10)
RDW: 14.1 % (ref 11.0–15.0)
Total Lymphocyte: 38.1 %
WBC: 6.7 10*3/uL (ref 3.8–10.8)

## 2023-09-05 LAB — LIPID PANEL
Cholesterol: 149 mg/dL (ref <200)
HDL: 48 mg/dL — ABNORMAL LOW (ref >=50)
LDL Cholesterol (Calc): 74 mg/dL
Non-HDL Cholesterol (Calc): 101 mg/dL (ref <130)
Total CHOL/HDL Ratio: 3.1 (calc) (ref <5.0)
Triglycerides: 176 mg/dL — ABNORMAL HIGH (ref <150)

## 2023-09-05 LAB — TSH: TSH: 2.09 m[IU]/L (ref 0.40–4.50)

## 2023-09-08 ENCOUNTER — Telehealth: Payer: Self-pay

## 2023-09-08 NOTE — Telephone Encounter (Signed)
-----   Message from Sharon Seller sent at 09/08/2023  8:14 AM EDT ----- Blood counts, electrolytes, glucose, liver and kidney function are normal Cholesterol looks good for age.  Thyroid normal.

## 2023-09-11 ENCOUNTER — Telehealth: Payer: Self-pay

## 2023-09-11 ENCOUNTER — Other Ambulatory Visit: Payer: Self-pay

## 2023-09-11 DIAGNOSIS — R32 Unspecified urinary incontinence: Secondary | ICD-10-CM | POA: Diagnosis not present

## 2023-09-11 NOTE — Telephone Encounter (Signed)
Patient came into office and dropped off handicap placard. Form placed into PCP Ngetich, Dinah C, NP review and sign folder.

## 2023-09-12 LAB — URINE CULTURE
MICRO NUMBER:: 15491779
SPECIMEN QUALITY:: ADEQUATE

## 2023-09-15 NOTE — Telephone Encounter (Signed)
Placard signed and placed on outgoing mil box.Patient will need to fill upper part.

## 2023-09-23 NOTE — Telephone Encounter (Signed)
Patient has upcoming appointment in October

## 2023-09-24 ENCOUNTER — Other Ambulatory Visit: Payer: Self-pay

## 2023-09-24 DIAGNOSIS — R32 Unspecified urinary incontinence: Secondary | ICD-10-CM

## 2023-09-29 ENCOUNTER — Other Ambulatory Visit (HOSPITAL_COMMUNITY): Payer: Self-pay

## 2023-10-01 ENCOUNTER — Other Ambulatory Visit: Payer: Self-pay | Admitting: Family

## 2023-10-01 ENCOUNTER — Other Ambulatory Visit: Payer: Self-pay

## 2023-10-01 ENCOUNTER — Other Ambulatory Visit (HOSPITAL_COMMUNITY): Payer: Self-pay

## 2023-10-01 DIAGNOSIS — F03918 Unspecified dementia, unspecified severity, with other behavioral disturbance: Secondary | ICD-10-CM

## 2023-10-01 MED ORDER — DIVALPROEX SODIUM 125 MG PO DR TAB
125.0000 mg | DELAYED_RELEASE_TABLET | Freq: Every day | ORAL | 1 refills | Status: DC
Start: 2023-10-01 — End: 2024-02-09
  Filled 2023-10-01: qty 30, 30d supply, fill #0
  Filled 2023-11-04: qty 30, 30d supply, fill #1
  Filled 2023-11-30 – 2023-12-02 (×2): qty 30, 30d supply, fill #2
  Filled 2023-12-16 – 2023-12-25 (×2): qty 30, 30d supply, fill #3
  Filled 2024-01-19 (×2): qty 30, 30d supply, fill #4
  Filled 2024-02-04 – 2024-02-11 (×2): qty 30, 30d supply, fill #5

## 2023-10-01 NOTE — Telephone Encounter (Signed)
Patient only given 30 day supply. Medication pend and sent to PCP Ngetich, Donalee Citrin, NP

## 2023-10-02 ENCOUNTER — Other Ambulatory Visit: Payer: Self-pay

## 2023-10-02 ENCOUNTER — Other Ambulatory Visit (HOSPITAL_COMMUNITY): Payer: Self-pay

## 2023-10-02 DIAGNOSIS — R32 Unspecified urinary incontinence: Secondary | ICD-10-CM | POA: Diagnosis not present

## 2023-10-03 ENCOUNTER — Other Ambulatory Visit: Payer: Self-pay

## 2023-10-03 LAB — URINE CULTURE
MICRO NUMBER:: 15580048
SPECIMEN QUALITY:: ADEQUATE

## 2023-10-06 ENCOUNTER — Ambulatory Visit: Payer: Medicare PPO | Admitting: Family

## 2023-10-13 ENCOUNTER — Other Ambulatory Visit: Payer: Self-pay

## 2023-10-13 ENCOUNTER — Other Ambulatory Visit (HOSPITAL_COMMUNITY): Payer: Self-pay

## 2023-10-13 ENCOUNTER — Encounter: Payer: Self-pay | Admitting: Family

## 2023-10-13 ENCOUNTER — Ambulatory Visit: Payer: Medicare PPO | Admitting: Family

## 2023-10-13 VITALS — BP 124/78 | HR 74 | Temp 97.3°F | Resp 18 | Ht 59.0 in | Wt 187.2 lb

## 2023-10-13 DIAGNOSIS — B3749 Other urogenital candidiasis: Secondary | ICD-10-CM

## 2023-10-13 DIAGNOSIS — Z23 Encounter for immunization: Secondary | ICD-10-CM

## 2023-10-13 DIAGNOSIS — F03918 Unspecified dementia, unspecified severity, with other behavioral disturbance: Secondary | ICD-10-CM | POA: Diagnosis not present

## 2023-10-13 MED ORDER — NYSTATIN 100000 UNIT/GM EX CREA
1.0000 | TOPICAL_CREAM | Freq: Two times a day (BID) | CUTANEOUS | 3 refills | Status: DC
Start: 2023-10-13 — End: 2024-02-09
  Filled 2023-10-13: qty 30, 15d supply, fill #0

## 2023-10-13 NOTE — Progress Notes (Unsigned)
Provider: Richarda Blade FNP-C  Karey Suthers, Donalee Citrin, NP  Patient Care Team: Jacquees Gongora, Donalee Citrin, NP as PCP - General (Family Medicine)  Extended Emergency Contact Information Primary Emergency Contact: Clois Comber of [place] Home Phone: [phone] Relation: Niece Secondary Emergency Contact: Goins,Raven Mobile Phone: [phone] Relation: Niece  Code Status:  Full Code  Goals of care: Advanced Directive information    09/04/2023    1:01 PM  Advanced Directives  Does Patient Have a Medical Advance Directive? Yes  Type of Estate agent of [place];Living will  Does patient want to make changes to medical advance directive? Yes (Inpatient - patient defers changing a medical advance directive at this time - Information given)  Copy of Healthcare Power of Attorney in Chart? No - copy requested     Chief Complaint  Patient presents with   Follow-up    1 mth fu    Immunizations    Shingrix, Pneumonia, Covid and DTAP   Quality Metric Gaps    Medicare Annual Wellness Visit    HPI:  Pt is a 82 y.o. female seen today for an acute visit for one month follow up for Dementia.she is here with her sister and her daughter.she was here on 09/04/2023 with complains of agitation and resistance to care. She was started on Depakote 125 mg at bedtime and advised to follow up today.POA states medication seems to be working well.Agitation has improved.No increase sedation reported.sleeping well at night.  Previous urine culture reviewed no symptoms of UTI noted.  She is due for shingles,Pneumonia ,COVID-19 and Dtdap vaccine.she will receive PNA vaccine this visit.   Care giver reports right groin area skin irritation.would like area checked. No drainage reported.    Past Medical History:  Diagnosis Date   Arthritis    Arthritis    Chronic pain    Dementia (HCC)    Dyslipidemia    Essential hypertension    Estrogen deficiency    Gait instability     Hypertension    LTBI (latent tuberculosis infection)    Rotator cuff disorder    right   Uveitis    Past Surgical History:  Procedure Laterality Date   ABDOMINAL HYSTERECTOMY     total   WRIST SURGERY Right    carpel tunnel yrs ago    No Known Allergies  Outpatient Encounter Medications as of 10/13/2023  Medication Sig   aspirin 81 MG tablet Take 81 mg by mouth daily.   atorvastatin (LIPITOR) 10 MG tablet Take 1 tablet (10 mg total) by mouth at bedtime.   divalproex (DEPAKOTE) 125 MG DR tablet Take 1 tablet (125 mg total) by mouth daily.   donepezil (ARICEPT) 10 MG tablet Take 1 tablet (10 mg total) by mouth every morning.   losartan-hydrochlorothiazide (HYZAAR) 50-12.5 MG tablet Take 1 tablet by mouth daily.   potassium chloride SA (KLOR-CON M) 20 MEQ tablet Take 1 tablet (20 mEq total) by mouth daily with food.   No facility-administered encounter medications on file as of 10/13/2023.    Review of Systems  Constitutional:  Negative for appetite change, chills, fatigue, fever and unexpected weight change.  Eyes:  Negative for pain, discharge, redness, itching and visual disturbance.  Respiratory:  Negative for cough, chest tightness, shortness of breath and wheezing.   Cardiovascular:  Negative for chest pain, palpitations and leg swelling.  Gastrointestinal:  Negative for abdominal distention, abdominal pain, constipation, diarrhea, nausea and vomiting.  Genitourinary:  Negative for difficulty urinating, dysuria, flank  pain, frequency and urgency.       Incontinent   Musculoskeletal:  Positive for gait problem. Negative for arthralgias, back pain, joint swelling and myalgias.  Skin:  Negative for color change, pallor, rash and wound.  Neurological:  Negative for dizziness, speech difficulty, weakness, light-headedness, numbness and headaches.  Psychiatric/Behavioral:  Negative for agitation, behavioral problems, confusion, hallucinations and sleep disturbance. The patient is  not nervous/anxious.        Agitation has improved     Immunization History  Administered Date(s) Administered   Fluad Trivalent(High Dose 65+) 09/04/2023   Influenza-Unspecified 01/31/2015   Pneumococcal-Unspecified 12/23/2013   Pertinent  Health Maintenance Due  Topic Date Due   INFLUENZA VACCINE  Completed   DEXA SCAN  Completed      01/31/2015    4:28 PM 05/21/2017   10:10 AM 09/04/2023    1:01 PM 10/13/2023    2:16 PM  Fall Risk  Falls in the past year? Yes Yes 0 0  Was there an injury with Fall? No Yes  0  Was there an injury with Fall? - Comments  hit head- "knot still there"    Fall Risk Category Calculator    0  Patient at Risk for Falls Due to History of fall(s) Impaired balance/gait  No Fall Risks  Fall risk Follow up    Falls evaluation completed   Functional Status Survey:    Vitals:   10/13/23 1410  BP: 124/78  Pulse: 74  Resp: 18  Temp: (!) 97.3 F (36.3 C)  SpO2: 97%  Weight: 187 lb 3.2 oz (84.9 kg)  Height: 4\' 11"  (1.499 m)   Body mass index is 37.81 kg/m. Physical Exam Vitals reviewed.  Constitutional:      General: She is not in acute distress.    Appearance: Normal appearance. She is obese. She is not ill-appearing or diaphoretic.  HENT:     Head: Normocephalic.     Mouth/Throat:     Mouth: Mucous membranes are moist.     Pharynx: Oropharynx is clear. No oropharyngeal exudate or posterior oropharyngeal erythema.  Eyes:     General: No scleral icterus.       Right eye: No discharge.        Left eye: No discharge.     Extraocular Movements: Extraocular movements intact.     Conjunctiva/sclera: Conjunctivae normal.     Pupils: Pupils are equal, round, and reactive to light.  Neck:     Vascular: No carotid bruit.  Cardiovascular:     Rate and Rhythm: Normal rate and regular rhythm.     Pulses: Normal pulses.     Heart sounds: Normal heart sounds. No murmur heard.    No friction rub. No gallop.  Pulmonary:     Effort: Pulmonary effort  is normal. No respiratory distress.     Breath sounds: Normal breath sounds. No wheezing, rhonchi or rales.  Chest:     Chest wall: No tenderness.  Abdominal:     General: Bowel sounds are normal. There is no distension.     Palpations: Abdomen is soft. There is no mass.     Tenderness: There is no abdominal tenderness. There is no right CVA tenderness, left CVA tenderness, guarding or rebound.  Musculoskeletal:        General: No swelling or tenderness. Normal range of motion.     Cervical back: Normal range of motion. No rigidity or tenderness.     Right lower leg: No edema.  Left lower leg: No edema.  Lymphadenopathy:     Cervical: No cervical adenopathy.  Skin:    General: Skin is warm and dry.     Coloration: Skin is not pale.     Findings: No erythema or rash.     Comments: Right groin area skin Slivery shinny color. No drainage noted.   Neurological:     Mental Status: She is alert and oriented to person, place, and time.     Cranial Nerves: No cranial nerve deficit.     Sensory: No sensory deficit.     Motor: No weakness.     Coordination: Coordination normal.     Gait: Gait abnormal.  Psychiatric:        Mood and Affect: Mood normal.        Speech: Speech normal.        Behavior: Behavior normal.        Cognition and Memory: Memory is impaired.     Labs reviewed: Recent Labs    07/17/23 1526 09/04/23 1440  NA 136 140  K 3.6 3.6  CL 101 102  CO2 22 27  GLUCOSE 103* 82  BUN 10 7  CREATININE 0.78 0.89  CALCIUM 8.6* 8.7   Recent Labs    07/17/23 1526 09/04/23 1440  AST 18 13  ALT 14 12  ALKPHOS 92  --   BILITOT 1.5* 0.8  PROT 7.7 6.7  ALBUMIN 3.7  --    Recent Labs    07/17/23 1526 09/04/23 1440  WBC 7.7 6.7  NEUTROABS 7.0 3,243  HGB 14.2 13.5  HCT 43.6 41.4  MCV 85.7 85.5  PLT 289 288   Lab Results  Component Value Date   TSH 2.09 09/04/2023   Lab Results  Component Value Date   HGBA1C 5.3 04/05/2017   Lab Results  Component  Value Date   CHOL 149 09/04/2023   HDL 48 (L) 09/04/2023   LDLCALC 74 09/04/2023   TRIG 176 (H) 09/04/2023   CHOLHDL 3.1 09/04/2023    Significant Diagnostic Results in last 30 days:  No results found.  Assessment/Plan There are no diagnoses linked to this encounter.   Family/ staff Communication: Reviewed plan of care with patient,sister and daughter verbalized understanding   Labs/tests ordered: None   Next Appointment: Return in about 4 months (around 02/13/2024) for medical mangement of chronic issues., fasting labs prior to visit.   Caesar Bookman, NP

## 2023-10-29 ENCOUNTER — Ambulatory Visit
Admission: RE | Admit: 2023-10-29 | Discharge: 2023-10-29 | Disposition: A | Discharge status: Home / Self Care | Payer: Medicare PPO | Source: Ambulatory Visit | Attending: Family | Admitting: Family

## 2023-10-29 ENCOUNTER — Encounter: Payer: Self-pay | Admitting: Family

## 2023-10-29 ENCOUNTER — Ambulatory Visit: Payer: Medicare PPO | Admitting: Family

## 2023-10-29 VITALS — BP 128/70 | HR 86 | Temp 96.4°F | Resp 16

## 2023-10-29 DIAGNOSIS — M25562 Pain in left knee: Secondary | ICD-10-CM

## 2023-10-29 DIAGNOSIS — R404 Transient alteration of awareness: Secondary | ICD-10-CM

## 2023-10-29 DIAGNOSIS — M1711 Unilateral primary osteoarthritis, right knee: Secondary | ICD-10-CM | POA: Diagnosis not present

## 2023-10-29 NOTE — Progress Notes (Signed)
Provider: Richarda Blade FNP-C  Heatherly Stenner, Donalee Citrin, NP  Patient Care Team: Charlott Calvario, Donalee Citrin, NP as PCP - General (Family Medicine)  Extended Emergency Contact Information Primary Emergency Contact: Clois Comber of [place] Home Phone: [phone] Relation: Niece Secondary Emergency Contact: Goins,Raven Mobile Phone: [phone] Relation: Niece  Code Status:  Full Code  Goals of care: Advanced Directive information    10/29/2023    2:57 PM  Advanced Directives  Does Patient Have a Medical Advance Directive? Yes  Does patient want to make changes to medical advance directive? No - Patient declined     Chief Complaint  Patient presents with   Medical Management of Chronic Issues    Changes in behavior need to discuss tdap covid and shingles vaccine and annual wellness visit    HPI:  Pt is a 82 y.o. female seen today for an acute visit for evaluation of change in behavior.she is here with her sister and daughter who provides HPI information.States for the past few weeks patient has had change in behavior.Care givers reports episodes of unresponsiveness,slamps over on the wheelchair for few minutes not verbally responsive after few minutes she becomes responsive.EMS was called when she had an episode but by the time EMS arrived she became responsive and back to her baseline.     Past Medical History:  Diagnosis Date   Arthritis    Arthritis    Chronic pain    Dementia (HCC)    Dyslipidemia    Essential hypertension    Estrogen deficiency    Gait instability    Hypertension    LTBI (latent tuberculosis infection)    Rotator cuff disorder    right   Uveitis    Past Surgical History:  Procedure Laterality Date   ABDOMINAL HYSTERECTOMY     total   WRIST SURGERY Right    carpel tunnel yrs ago    No Known Allergies  Outpatient Encounter Medications as of 10/29/2023  Medication Sig   atorvastatin (LIPITOR) 10 MG tablet Take 1 tablet (10 mg total)  by mouth at bedtime.   divalproex (DEPAKOTE) 125 MG DR tablet Take 1 tablet (125 mg total) by mouth daily.   donepezil (ARICEPT) 10 MG tablet Take 1 tablet (10 mg total) by mouth every morning.   losartan-hydrochlorothiazide (HYZAAR) 50-12.5 MG tablet Take 1 tablet by mouth daily.   potassium chloride SA (KLOR-CON M) 20 MEQ tablet Take 1 tablet (20 mEq total) by mouth daily with food.   aspirin 81 MG tablet Take 81 mg by mouth daily. (Patient not taking: Reported on 10/29/2023)   nystatin cream (MYCOSTATIN) Apply 1 Application topically 2 (two) times daily. (Patient not taking: Reported on 10/29/2023)   No facility-administered encounter medications on file as of 10/29/2023.    Review of Systems  Constitutional:  Negative for appetite change, chills, fatigue, fever and unexpected weight change.  HENT:  Negative for congestion, dental problem, ear discharge, ear pain, facial swelling, hearing loss, nosebleeds, postnasal drip, rhinorrhea, sinus pressure, sinus pain, sneezing, sore throat, tinnitus and trouble swallowing.   Eyes:  Negative for pain, discharge, redness, itching and visual disturbance.  Respiratory:  Negative for cough, chest tightness, shortness of breath and wheezing.   Cardiovascular:  Negative for chest pain, palpitations and leg swelling.  Gastrointestinal:  Negative for abdominal distention, abdominal pain, blood in stool, constipation, diarrhea, nausea and vomiting.  Endocrine: Negative for cold intolerance, heat intolerance, polydipsia, polyphagia and polyuria.  Genitourinary:  Negative for difficulty urinating, dysuria,  flank pain, frequency and urgency.  Musculoskeletal:  Positive for gait problem. Negative for arthralgias, back pain, joint swelling, myalgias, neck pain and neck stiffness.  Skin:  Negative for color change, pallor, rash and wound.  Neurological:  Negative for dizziness, syncope, speech difficulty, weakness, light-headedness, numbness and headaches.        Episodes of unresponsiveness at home  Hematological:  Does not bruise/bleed easily.  Psychiatric/Behavioral:  Negative for agitation, behavioral problems, confusion, hallucinations, self-injury, sleep disturbance and suicidal ideas. The patient is not nervous/anxious.        Impaired memory    Immunization History  Administered Date(s) Administered   Fluad Trivalent(High Dose 65+) 09/04/2023   Influenza-Unspecified 01/31/2015   PNEUMOCOCCAL CONJUGATE-20 10/13/2023   Pneumococcal-Unspecified 12/23/2013   Pertinent  Health Maintenance Due  Topic Date Due   INFLUENZA VACCINE  Completed   DEXA SCAN  Completed      01/31/2015    4:28 PM 05/21/2017   10:10 AM 09/04/2023    1:01 PM 10/13/2023    2:16 PM 10/29/2023    2:56 PM  Fall Risk  Falls in the past year? Yes Yes 0 0 0  Was there an injury with Fall? No Yes  0 0  Was there an injury with Fall? - Comments  hit head- "knot still there"     Fall Risk Category Calculator    0 0  Patient at Risk for Falls Due to History of fall(s) Impaired balance/gait  No Fall Risks No Fall Risks  Fall risk Follow up    Falls evaluation completed    Functional Status Survey:    Vitals:   10/29/23 1503  BP: 128/70  Pulse: 86  Resp: 16  Temp: (!) 96.4 F (35.8 C)  SpO2: 93%   There is no height or weight on file to calculate BMI. Physical Exam Vitals reviewed.  Constitutional:      General: She is not in acute distress.    Appearance: Normal appearance. She is not ill-appearing or diaphoretic.  HENT:     Head: Normocephalic.     Right Ear: Tympanic membrane, ear canal and external ear normal. There is no impacted cerumen.     Left Ear: Tympanic membrane, ear canal and external ear normal. There is no impacted cerumen.     Nose: Nose normal. No congestion or rhinorrhea.     Mouth/Throat:     Mouth: Mucous membranes are moist.     Pharynx: Oropharynx is clear. No oropharyngeal exudate or posterior oropharyngeal erythema.  Eyes:      General: No scleral icterus.       Right eye: No discharge.        Left eye: No discharge.     Extraocular Movements: Extraocular movements intact.     Conjunctiva/sclera: Conjunctivae normal.     Pupils: Pupils are equal, round, and reactive to light.  Neck:     Vascular: No carotid bruit.  Cardiovascular:     Rate and Rhythm: Normal rate and regular rhythm.     Pulses: Normal pulses.     Heart sounds: Normal heart sounds. No murmur heard.    No friction rub. No gallop.  Pulmonary:     Effort: Pulmonary effort is normal. No respiratory distress.     Breath sounds: Normal breath sounds. No wheezing, rhonchi or rales.  Chest:     Chest wall: No tenderness.  Abdominal:     General: Bowel sounds are normal. There is no distension.  Palpations: Abdomen is soft. There is no mass.     Tenderness: There is no abdominal tenderness. There is no right CVA tenderness, left CVA tenderness, guarding or rebound.  Musculoskeletal:        General: No swelling or tenderness. Normal range of motion.     Cervical back: Normal range of motion. No rigidity or tenderness.     Right lower leg: No edema.     Left lower leg: No edema.  Lymphadenopathy:     Cervical: No cervical adenopathy.  Skin:    General: Skin is warm and dry.     Coloration: Skin is not pale.     Findings: No bruising, erythema, lesion or rash.  Neurological:     Mental Status: She is alert and oriented to person, place, and time.     Cranial Nerves: No cranial nerve deficit.     Sensory: No sensory deficit.     Motor: No weakness.     Coordination: Coordination normal.     Gait: Gait normal.  Psychiatric:        Mood and Affect: Mood normal.        Speech: Speech normal.        Behavior: Behavior normal.        Thought Content: Thought content normal.        Cognition and Memory: Memory is impaired. She exhibits impaired recent memory.     Labs reviewed: Recent Labs    07/17/23 1526 09/04/23 1440  NA 136 140  K  3.6 3.6  CL 101 102  CO2 22 27  GLUCOSE 103* 82  BUN 10 7  CREATININE 0.78 0.89  CALCIUM 8.6* 8.7   Recent Labs    07/17/23 1526 09/04/23 1440  AST 18 13  ALT 14 12  ALKPHOS 92  --   BILITOT 1.5* 0.8  PROT 7.7 6.7  ALBUMIN 3.7  --    Recent Labs    07/17/23 1526 09/04/23 1440  WBC 7.7 6.7  NEUTROABS 7.0 3,243  HGB 14.2 13.5  HCT 43.6 41.4  MCV 85.7 85.5  PLT 289 288   Lab Results  Component Value Date   TSH 2.09 09/04/2023   Lab Results  Component Value Date   HGBA1C 5.3 04/05/2017   Lab Results  Component Value Date   CHOL 149 09/04/2023   HDL 48 (L) 09/04/2023   LDLCALC 74 09/04/2023   TRIG 176 (H) 09/04/2023   CHOLHDL 3.1 09/04/2023    Significant Diagnostic Results in last 30 days:  No results found.  Assessment/Plan 1. Acute pain of left knee No swelling or erythema - continue OTC  analgesic - DG Knee Complete 4 Views Right; Future  2. Unresponsive episode Had episodes of unresponsive at home by the time EMS arrived she was back to her baseline.Daughter request work up.will refer to Neurologist if indicated  - CT HEAD WO CONTRAST ( ); Future - advised to go to ED if symptoms recurs  Family/ staff Communication: Reviewed plan of care with patient verbalized understanding.   Labs/tests ordered: - CT HEAD WO CONTRAST ( ); Future  Next Appointment: Return if symptoms worsen or fail to improve.   Caesar Bookman, NP

## 2023-10-29 NOTE — Patient Instructions (Signed)
-   Please get right knee  X-ray at [facility] then will call you with results.

## 2023-11-04 ENCOUNTER — Other Ambulatory Visit (HOSPITAL_COMMUNITY): Payer: Self-pay

## 2023-11-04 ENCOUNTER — Other Ambulatory Visit: Payer: Self-pay

## 2023-11-12 ENCOUNTER — Ambulatory Visit
Admission: RE | Admit: 2023-11-12 | Discharge: 2023-11-12 | Disposition: A | Discharge status: Home / Self Care | Payer: Medicare PPO | Source: Ambulatory Visit | Attending: Family | Admitting: Family

## 2023-11-12 DIAGNOSIS — R404 Transient alteration of awareness: Secondary | ICD-10-CM

## 2023-11-18 ENCOUNTER — Other Ambulatory Visit: Payer: Self-pay

## 2023-11-19 ENCOUNTER — Other Ambulatory Visit: Payer: Self-pay

## 2023-11-19 DIAGNOSIS — M25561 Pain in right knee: Secondary | ICD-10-CM

## 2023-11-19 DIAGNOSIS — R936 Abnormal findings on diagnostic imaging of limbs: Secondary | ICD-10-CM

## 2023-12-01 ENCOUNTER — Ambulatory Visit: Payer: Medicare PPO | Admitting: Surgical

## 2023-12-01 ENCOUNTER — Other Ambulatory Visit: Payer: Self-pay

## 2023-12-01 DIAGNOSIS — M1711 Unilateral primary osteoarthritis, right knee: Secondary | ICD-10-CM | POA: Diagnosis not present

## 2023-12-02 ENCOUNTER — Other Ambulatory Visit: Payer: Self-pay

## 2023-12-07 ENCOUNTER — Encounter: Payer: Self-pay | Admitting: Surgical

## 2023-12-07 MED ORDER — METHYLPREDNISOLONE ACETATE 40 MG/ML IJ SUSP
40.0000 mg | INTRAMUSCULAR | Status: AC | PRN
Start: 2023-12-01 — End: 2023-12-01
  Administered 2023-12-01: 40 mg via INTRA_ARTICULAR

## 2023-12-07 MED ORDER — BUPIVACAINE HCL 0.25 % IJ SOLN
4.0000 mL | INTRAMUSCULAR | Status: AC | PRN
Start: 2023-12-01 — End: 2023-12-01
  Administered 2023-12-01: 4 mL via INTRA_ARTICULAR

## 2023-12-07 NOTE — Progress Notes (Signed)
Office Visit Note   Patient: Sheila Logan           Date of Birth: Dec 14, 1941           MRN: [id number] Visit Date: 12/01/2023 Requested by: Caesar Bookman, NP [address] [place],  [place] [address] PCP: Ngetich, Donalee Citrin, NP  Subjective: Chief Complaint  Patient presents with   Right Knee - Pain    HPI: Sheila Logan is a 82 y.o. female who presents to the office reporting right knee pain.  She is accompanied by her sister and great niece (who is acting as power of attorney).  She lives with both of them at home.  She has history of significant dementia unfortunately.  She and her family describe right knee pain that comes and goes.  This has significantly limited her mobility in the last 9 months.  No falls.  She has described "fluid" in her knee.  No history of prior knee surgery.  No complaint of hip pain or radiating pain.  Has tried over-the-counter Tylenol with some relief.  No history of diabetes or smoking..                ROS: All systems reviewed are negative as they relate to the chief complaint within the history of present illness.  Patient denies fevers or chills.  Assessment & Plan: Visit Diagnoses:  1. Unilateral primary osteoarthritis, right knee     Plan: Patient is an 82 year old female with history of dementia who presents with her power of attorney and family members for evaluation of right knee pain.  Has significant decrease in her mobility over the last several months with continued pain of the right knee.  She has had radiographs ordered by her PCP which demonstrate moderate tricompartmental osteoarthritis; this was reviewed with the patient and her family today.  After discussion of options, they would like to try right knee injection today.  This was administered without complication and we will see how this affects the patient.  We also get the gel injection preapproved to try this in the future if cortisone injection is short-lived.  This  patient is diagnosed with osteoarthritis of the knee(s).    Radiographs show evidence of joint space narrowing, osteophytes, subchondral sclerosis and/or subchondral cysts.  This patient has knee pain which interferes with functional and activities of daily living.    This patient has experienced inadequate response, adverse effects and/or intolerance with conservative treatments such as acetaminophen, NSAIDS, topical creams, physical therapy or regular exercise, knee bracing and/or weight loss.   This patient has experienced inadequate response or has a contraindication to intra articular steroid injections for at least 3 months.   This patient is not scheduled to have a total knee replacement within 6 months of starting treatment with viscosupplementation.   Follow-Up Instructions: No follow-ups on file.   Orders:  No orders of the defined types were placed in this encounter.  No orders of the defined types were placed in this encounter.     Procedures: Large Joint Inj: R knee on 12/01/2023 11:58 AM Indications: diagnostic evaluation, joint swelling and pain Details: 25 G 1.5 in needle, superolateral approach  Arthrogram: No  Medications: 40 mg methylPREDNISolone acetate 40 MG/ML; 4 mL bupivacaine 0.25 % Outcome: tolerated well, no immediate complications Procedure, treatment alternatives, risks and benefits explained, specific risks discussed. Consent was given by the patient. Immediately prior to procedure a time out was called to verify the correct  patient, procedure, equipment, support staff and site/side marked as required. Patient was prepped and draped in the usual sterile fashion.       Clinical Data: No additional findings.  Objective: Vital Signs: There were no vitals taken for this visit.  Physical Exam:  Constitutional: Patient appears well-developed HEENT:  Head: Normocephalic Eyes:EOM are normal Neck: Normal range of motion Cardiovascular: Normal  rate Pulmonary/chest: Effort normal Neurologic: Patient is alert Skin: Skin is warm Psychiatric: Patient has normal mood and affect  Ortho Exam: Ortho exam demonstrates right knee with 3 degrees extension and 110 degrees of knee flexion.  Small effusion is present.  No calf tenderness.  Negative Homans' sign.  No pain with hip range of motion.  Stable to anterior posterior drawer sign.  Tenderness to palpation both over the medial and lateral joint lines but more so over the medial joint line.  No cellulitis or skin changes noted.  She is able to perform straight leg raise with excellent quad strength  Specialty Comments:  No specialty comments available.  Imaging: No results found.   PMFS History: Patient Active Problem List   Diagnosis Date Noted   Edema of extremities 08/09/2018   Chronic diastolic CHF (congestive heart failure) (HCC) 08/09/2018   HLD (hyperlipidemia) 08/09/2018   UTI (urinary tract infection) 04/05/2017   Syncope 04/05/2017   Orthostatic hypotension 04/05/2017   Pruritic rash 04/05/2017   LTBI (latent tuberculosis infection) 01/31/2015   HTN (hypertension) 01/31/2015   Past Medical History:  Diagnosis Date   Arthritis    Arthritis    Chronic pain    Dementia (HCC)    Dyslipidemia    Essential hypertension    Estrogen deficiency    Gait instability    Hypertension    LTBI (latent tuberculosis infection)    Rotator cuff disorder    right   Uveitis     Family History  Problem Relation Age of Onset   Diabetes Mother    Hypertension Mother    Prostate cancer Father    Other Father        heart surgery   Diabetes Sister    Hypertension Brother    Cancer Brother        in 1 brother, unknown type   Heart attack Paternal Grandmother     Past Surgical History:  Procedure Laterality Date   ABDOMINAL HYSTERECTOMY     total   WRIST SURGERY Right    carpel tunnel yrs ago   Social History   Occupational History    Comment: retired Runner, broadcasting/film/video   Tobacco Use   Smoking status: Former    Current packs/day: 0.00    Types: Cigarettes    Quit date: 12/24/1971    Years since quitting: 51.9   Smokeless tobacco: Never  Vaping Use   Vaping status: Never Used  Substance and Sexual Activity   Alcohol use: No    Alcohol/week: 0.0 standard drinks of alcohol   Drug use: No   Sexual activity: Not on file

## 2023-12-09 ENCOUNTER — Telehealth: Payer: Self-pay

## 2023-12-09 NOTE — Telephone Encounter (Signed)
Auth needed for gel 

## 2023-12-09 NOTE — Telephone Encounter (Signed)
-----   Message from [facility] sent at 12/07/2023 11:58 AM EST ----- Johnsie Kindred can we get this patient approved for right knee gel injection?  Thank you

## 2023-12-16 ENCOUNTER — Other Ambulatory Visit: Payer: Self-pay

## 2023-12-22 ENCOUNTER — Other Ambulatory Visit: Payer: Self-pay

## 2023-12-25 ENCOUNTER — Other Ambulatory Visit: Payer: Self-pay

## 2024-01-07 NOTE — Telephone Encounter (Signed)
 VOB submitted for Monovisc, right knee

## 2024-01-19 ENCOUNTER — Other Ambulatory Visit: Payer: Self-pay

## 2024-01-19 ENCOUNTER — Other Ambulatory Visit (HOSPITAL_COMMUNITY): Payer: Self-pay

## 2024-02-02 ENCOUNTER — Other Ambulatory Visit: Payer: Self-pay | Admitting: Family

## 2024-02-02 ENCOUNTER — Other Ambulatory Visit (HOSPITAL_BASED_OUTPATIENT_CLINIC_OR_DEPARTMENT_OTHER): Payer: Self-pay

## 2024-02-02 DIAGNOSIS — I10 Essential (primary) hypertension: Secondary | ICD-10-CM

## 2024-02-02 DIAGNOSIS — E785 Hyperlipidemia, unspecified: Secondary | ICD-10-CM

## 2024-02-02 MED ORDER — LOSARTAN POTASSIUM-HCTZ 50-12.5 MG PO TABS
1.0000 | ORAL_TABLET | Freq: Every day | ORAL | 5 refills | Status: DC
Start: 2024-02-02 — End: 2024-08-16
  Filled 2024-02-02 – 2024-02-11 (×4): qty 30, 30d supply, fill #0
  Filled 2024-02-16 – 2024-03-09 (×2): qty 30, 30d supply, fill #1
  Filled 2024-03-29 – 2024-04-01 (×2): qty 30, 30d supply, fill #2
  Filled 2024-04-14 – 2024-04-27 (×2): qty 30, 30d supply, fill #3
  Filled 2024-05-12 – 2024-06-04 (×3): qty 30, 30d supply, fill #4
  Filled 2024-07-13 – 2024-07-26 (×2): qty 30, 30d supply, fill #5

## 2024-02-02 MED ORDER — ATORVASTATIN CALCIUM 10 MG PO TABS
10.0000 mg | ORAL_TABLET | Freq: Every evening | ORAL | 5 refills | Status: DC
  Filled 2024-02-02 – 2024-02-11 (×4): qty 30, 30d supply, fill #0
  Filled 2024-02-16 – 2024-03-09 (×2): qty 30, 30d supply, fill #1
  Filled 2024-03-29 – 2024-04-01 (×2): qty 30, 30d supply, fill #2
  Filled 2024-04-14 – 2024-04-27 (×2): qty 30, 30d supply, fill #3
  Filled 2024-05-12 – 2024-06-04 (×3): qty 30, 30d supply, fill #4
  Filled 2024-07-13 – 2024-07-26 (×2): qty 30, 30d supply, fill #5

## 2024-02-04 ENCOUNTER — Other Ambulatory Visit: Payer: Self-pay

## 2024-02-04 ENCOUNTER — Other Ambulatory Visit: Payer: Self-pay | Admitting: Family

## 2024-02-04 ENCOUNTER — Other Ambulatory Visit (HOSPITAL_COMMUNITY): Payer: Self-pay

## 2024-02-04 DIAGNOSIS — I10 Essential (primary) hypertension: Secondary | ICD-10-CM

## 2024-02-04 MED ORDER — POTASSIUM CHLORIDE CRYS ER 20 MEQ PO TBCR
20.0000 meq | EXTENDED_RELEASE_TABLET | Freq: Every day | ORAL | 5 refills | Status: DC
  Filled 2024-02-11 (×2): qty 30, 30d supply, fill #0
  Filled 2024-02-16 – 2024-03-09 (×2): qty 30, 30d supply, fill #1
  Filled 2024-03-29 – 2024-04-01 (×2): qty 30, 30d supply, fill #2
  Filled 2024-04-14 – 2024-04-27 (×2): qty 30, 30d supply, fill #3
  Filled 2024-05-12: qty 30, 30d supply, fill #4
  Filled ????-??-??: fill #4

## 2024-02-05 ENCOUNTER — Other Ambulatory Visit: Payer: Self-pay

## 2024-02-09 ENCOUNTER — Encounter: Payer: Self-pay | Admitting: Adult Health

## 2024-02-09 ENCOUNTER — Ambulatory Visit (INDEPENDENT_AMBULATORY_CARE_PROVIDER_SITE_OTHER): Payer: Medicare PPO | Admitting: Adult Health

## 2024-02-09 ENCOUNTER — Telehealth: Payer: Self-pay | Admitting: Family

## 2024-02-09 ENCOUNTER — Other Ambulatory Visit: Payer: Medicare PPO

## 2024-02-09 VITALS — BP 120/80 | HR 71 | Temp 97.8°F | Resp 20 | Ht 59.0 in | Wt 179.4 lb

## 2024-02-09 DIAGNOSIS — E781 Pure hyperglyceridemia: Secondary | ICD-10-CM | POA: Diagnosis not present

## 2024-02-09 DIAGNOSIS — Z131 Encounter for screening for diabetes mellitus: Secondary | ICD-10-CM

## 2024-02-09 DIAGNOSIS — I5032 Chronic diastolic (congestive) heart failure: Secondary | ICD-10-CM

## 2024-02-09 DIAGNOSIS — F03918 Unspecified dementia, unspecified severity, with other behavioral disturbance: Secondary | ICD-10-CM | POA: Diagnosis not present

## 2024-02-09 DIAGNOSIS — I10 Essential (primary) hypertension: Secondary | ICD-10-CM | POA: Diagnosis not present

## 2024-02-09 DIAGNOSIS — E785 Hyperlipidemia, unspecified: Secondary | ICD-10-CM

## 2024-02-09 DIAGNOSIS — E782 Mixed hyperlipidemia: Secondary | ICD-10-CM

## 2024-02-09 DIAGNOSIS — L602 Onychogryphosis: Secondary | ICD-10-CM

## 2024-02-09 DIAGNOSIS — E876 Hypokalemia: Secondary | ICD-10-CM | POA: Diagnosis not present

## 2024-02-09 NOTE — Telephone Encounter (Signed)
 Labs ordered.

## 2024-02-09 NOTE — Telephone Encounter (Signed)
-----   Message from [facility] W sent at 02/09/2024  8:22 AM EST ----- Good morning,  This pt is on lab schedule and needs orders placed.Let  me know when orders are placed and I'll release.   Thanks,  DW

## 2024-02-09 NOTE — Progress Notes (Unsigned)
[facility] clinic  Provider:  Kenard Gower DNP  Code Status:  Full Code Goals of Care:     02/09/2024   12:37 PM  Advanced Directives  Does Patient Have a Medical Advance Directive? Yes  Type of Estate agent of [place];Living will;Out of facility DNR (pink MOST or yellow form)  Does patient want to make changes to medical advance directive? No - Patient declined  Copy of Healthcare Power of Attorney in Chart? No - copy requested     Chief Complaint  Patient presents with   Medical Management of Chronic Issues    4 month follow up.    Immunizations    Discuss the need for DTAP vaccine, Shingrix vaccine, and Covid Booster.   Health Maintenance    Discuss the need for AWV.    Labs     TSH, CBC, CMP, LIPID PANEL.   Discussed the use of AI scribe software for clinical note transcription with the patient, who gave verbal consent to proceed.   HPI: Patient is a 83 y.o. female seen today for a 75-month follow up of chronic medical issues.  Sheila Logan is an 83 year old female with hypertension, hyperlipidemia, dementia, and chronic diastolic heart failure who presents for a four month follow-up. She is accompanied by her sister and niece who are her primary caregivers.  Hypertension is managed with losartan hydrochlorothiazide 50/12.5 mg daily, and her blood pressure is well-controlled at home. BP today 120/80.  She has hyperlipidemia with a history of elevated triglycerides. Her last lipid panel showed triglycerides at 176 mg/dL, cholesterol at 409 mg/dL, and LDL at 74 mg/dL. She takes atorvastatin 10 mg daily for cholesterol management.  Dementia is managed with Aricept 10 mg daily. A previous trial of Depakote for behavioral issues was discontinued due to lethargy. Her caregiver reports she is stable and not experiencing agitation.  She has chronic diastolic congestive heart failure but is not on specific medication for this condition. She takes  potassium supplements due to a history of low potassium levels. Her caregiver notes slight ankle swelling, but no shortness of breath.  She requires prompting to eat but consumes a good amount of food when she does. She has lost weight from 188 lbs in September to 179 lbs currently and is considered obese based on her BMI, 36.23.  She is incontinent of both urine and stool, managed with diapers and assistance from an aide who helps in the morning and at night.  She uses a walker for mobility and has an aide who helps with exercises. She does not initiate activities and requires assistance for daily tasks.  She sleeps well and uses a hospital bed. She is not agitated and is generally calm.    Past Medical History:  Diagnosis Date   Arthritis    Arthritis    Chronic pain    Dementia (HCC)    Dyslipidemia    Essential hypertension    Estrogen deficiency    Gait instability    Hypertension    LTBI (latent tuberculosis infection)    Rotator cuff disorder    right   Uveitis     Past Surgical History:  Procedure Laterality Date   ABDOMINAL HYSTERECTOMY     total   WRIST SURGERY Right    carpel tunnel yrs ago    No Known Allergies  Outpatient Encounter Medications as of 02/09/2024  Medication Sig   atorvastatin (LIPITOR) 10 MG tablet Take 1 tablet (10 mg total)  by mouth at bedtime.   donepezil (ARICEPT) 10 MG tablet Take 1 tablet (10 mg total) by mouth every morning.   losartan-hydrochlorothiazide (HYZAAR) 50-12.5 MG tablet Take 1 tablet by mouth daily.   potassium chloride SA (KLOR-CON M) 20 MEQ tablet Take 1 tablet (20 mEq total) by mouth daily with food.   [DISCONTINUED] aspirin 81 MG tablet Take 81 mg by mouth daily. (Patient not taking: Reported on 10/29/2023)   [DISCONTINUED] divalproex (DEPAKOTE) 125 MG DR tablet Take 1 tablet (125 mg total) by mouth daily.   [DISCONTINUED] nystatin cream (MYCOSTATIN) Apply 1 Application topically 2 (two) times daily. (Patient not taking:  Reported on 10/29/2023)   No facility-administered encounter medications on file as of 02/09/2024.    Review of Systems:  Review of Systems  Constitutional:  Negative for appetite change, chills, fatigue and fever.  HENT:  Negative for congestion, hearing loss, rhinorrhea and sore throat.   Eyes: Negative.   Respiratory:  Negative for cough, shortness of breath and wheezing.   Cardiovascular:  Negative for chest pain, palpitations and leg swelling.  Gastrointestinal:  Negative for abdominal pain, constipation, diarrhea, nausea and vomiting.  Genitourinary:  Negative for dysuria.  Musculoskeletal:  Negative for arthralgias, back pain and myalgias.  Skin:  Negative for color change, rash and wound.  Neurological:  Negative for dizziness, weakness and headaches.  Psychiatric/Behavioral:  Negative for behavioral problems. The patient is not nervous/anxious.     Health Maintenance  Topic Date Due   DTaP/Tdap/Td (1 - Tdap) Never done   Zoster Vaccines- Shingrix (1 of 2) Never done   Medicare Annual Wellness (AWV)  08/21/2023   COVID-19 Vaccine (4 - 2024-25 season) 08/24/2023   Pneumonia Vaccine 84+ Years old  Completed   INFLUENZA VACCINE  Completed   DEXA SCAN  Completed   HPV VACCINES  Aged Out    Physical Exam: Vitals:   02/09/24 1227  BP: 120/80  Pulse: 71  Resp: 20  Temp: 97.8 F (36.6 C)  SpO2: 98%  Weight: 179 lb 6.4 oz (81.4 kg)  Height: 4\' 11"  (1.499 m)   Body mass index is 36.23 kg/m. Physical Exam Constitutional:      General: She is not in acute distress.    Appearance: She is obese.  HENT:     Head: Normocephalic and atraumatic.     Nose: Nose normal.     Mouth/Throat:     Mouth: Mucous membranes are moist.  Eyes:     Conjunctiva/sclera: Conjunctivae normal.  Cardiovascular:     Rate and Rhythm: Normal rate and regular rhythm.  Pulmonary:     Effort: Pulmonary effort is normal.     Breath sounds: Normal breath sounds.  Abdominal:     General: Bowel  sounds are normal.     Palpations: Abdomen is soft.  Musculoskeletal:        General: Normal range of motion.     Cervical back: Normal range of motion.     Right lower leg: Edema present.     Left lower leg: Edema present.     Comments: Bilateral ankle 1+edema  Skin:    General: Skin is warm and dry.     Comments: Long and thick toenails  Neurological:     General: No focal deficit present.     Mental Status: She is alert and oriented to person, place, and time.  Psychiatric:        Mood and Affect: Mood normal.  Behavior: Behavior normal.        Thought Content: Thought content normal.        Judgment: Judgment normal.     Labs reviewed: Basic Metabolic Panel: Recent Labs    07/17/23 1526 09/04/23 1440  NA 136 140  K 3.6 3.6  CL 101 102  CO2 22 27  GLUCOSE 103* 82  BUN 10 7  CREATININE 0.78 0.89  CALCIUM 8.6* 8.7  TSH  --  2.09   Liver Function Tests: Recent Labs    07/17/23 1526 09/04/23 1440  AST 18 13  ALT 14 12  ALKPHOS 92  --   BILITOT 1.5* 0.8  PROT 7.7 6.7  ALBUMIN 3.7  --    Recent Labs    07/17/23 1526  LIPASE 29   No results for input(s): "AMMONIA" in the last 8760 hours. CBC: Recent Labs    07/17/23 1526 09/04/23 1440  WBC 7.7 6.7  NEUTROABS 7.0 3,243  HGB 14.2 13.5  HCT 43.6 41.4  MCV 85.7 85.5  PLT 289 288   Lipid Panel: Recent Labs    09/04/23 1440  CHOL 149  HDL 48*  LDLCALC 74  TRIG 132*  CHOLHDL 3.1   Lab Results  Component Value Date   HGBA1C 5.3 04/05/2017    Procedures since last visit: No results found.  Assessment/Plan  1. Primary hypertension (Primary) -  Well controlled on Losartan-Hydrochlorothiazide 50-12.5mg  daily. -Continue current medication regimen.  2. Hypertriglyceridemia -  Last lipid panel showed elevated triglycerides (176 mg/dL). Currently on Atorvastatin 10mg  daily. -Continue Atorvastatin 10mg  daily. -Encourage increased vegetable intake. -Repeat lipid panel today.  3.  Dementia with behavioral disturbance (HCC) -  On Donepezil (Aricept) 10mg  daily. Depakote discontinued due to lethargy. No current behavioral issues reported. -Continue Donepezil 10mg  daily.  4. Chronic diastolic CHF (congestive heart failure) (HCC) Bilateral ankle swelling noted, no reported shortness of breath. -Elevate feet at night. -  monitor weights weekly and notify provider for weight gain of  >3 lbs -  continue Losartan-Hydrochlorothiazide 50-12.5mg  daily.  5. Hypokalemia -  K 3.6, 09/04/23 -Continue current supplementation. -Check potassium level today.  6. Onychauxis -  has long and thick toenails - Ambulatory referral to Podiatry  7. Screening for diabetes mellitus - Hemoglobin A1C     General Health Maintenance -Follow-up in 3-4 months or sooner if any acute issues arise.     Labs/tests ordered:  A1C   Milton Streicher Medina-Vargas, NP

## 2024-02-10 LAB — CBC WITH DIFFERENTIAL/PLATELET
Absolute Lymphocytes: 2020 {cells}/uL (ref 850–3900)
Absolute Monocytes: 592 {cells}/uL (ref 200–950)
Basophils Absolute: 27 {cells}/uL (ref 0–200)
Basophils Relative: 0.4 %
Eosinophils Absolute: 211 {cells}/uL (ref 15–500)
Eosinophils Relative: 3.1 %
HCT: 42.3 % (ref 35.0–45.0)
Hemoglobin: 13.9 g/dL (ref 11.7–15.5)
MCH: 28.7 pg (ref 27.0–33.0)
MCHC: 32.9 g/dL (ref 32.0–36.0)
MCV: 87.4 fL (ref 80.0–100.0)
MPV: 10.9 fL (ref 7.5–12.5)
Monocytes Relative: 8.7 %
Neutro Abs: 3951 {cells}/uL (ref 1500–7800)
Neutrophils Relative %: 58.1 %
Platelets: 279 10*3/uL (ref 140–400)
RBC: 4.84 10*6/uL (ref 3.80–5.10)
RDW: 13.5 % (ref 11.0–15.0)
Total Lymphocyte: 29.7 %
WBC: 6.8 10*3/uL (ref 3.8–10.8)

## 2024-02-10 LAB — LIPID PANEL
Cholesterol: 149 mg/dL (ref <200)
HDL: 49 mg/dL — ABNORMAL LOW (ref >=50)
LDL Cholesterol (Calc): 74 mg/dL
Non-HDL Cholesterol (Calc): 100 mg/dL (ref <130)
Total CHOL/HDL Ratio: 3 (calc) (ref <5.0)
Triglycerides: 166 mg/dL — ABNORMAL HIGH (ref <150)

## 2024-02-10 LAB — COMPLETE METABOLIC PANEL WITH GFR
AG Ratio: 1.3 (calc) (ref 1.0–2.5)
ALT: 10 U/L (ref 6–29)
AST: 12 U/L (ref 10–35)
Albumin: 4 g/dL (ref 3.6–5.1)
Alkaline phosphatase (APISO): 91 U/L (ref 37–153)
BUN: 12 mg/dL (ref 7–25)
CO2: 28 mmol/L (ref 20–32)
Calcium: 9.2 mg/dL (ref 8.6–10.4)
Chloride: 105 mmol/L (ref 98–110)
Creat: 0.72 mg/dL (ref 0.60–0.95)
Globulin: 3.1 g/dL (ref 1.9–3.7)
Glucose, Bld: 88 mg/dL (ref 65–99)
Potassium: 3.8 mmol/L (ref 3.5–5.3)
Sodium: 145 mmol/L (ref 135–146)
Total Bilirubin: 1 mg/dL (ref 0.2–1.2)
Total Protein: 7.1 g/dL (ref 6.1–8.1)
eGFR: 84 mL/min/{1.73_m2} (ref >=60)

## 2024-02-10 LAB — HEMOGLOBIN A1C
Hgb A1c MFr Bld: 5.4 %{Hb} (ref <5.7)
Mean Plasma Glucose: 108 mg/dL
eAG (mmol/L): 6 mmol/L

## 2024-02-10 LAB — TSH: TSH: 2.42 m[IU]/L (ref 0.40–4.50)

## 2024-02-11 ENCOUNTER — Other Ambulatory Visit: Payer: Self-pay

## 2024-02-11 ENCOUNTER — Other Ambulatory Visit (HOSPITAL_COMMUNITY): Payer: Self-pay

## 2024-02-16 ENCOUNTER — Other Ambulatory Visit: Payer: Self-pay

## 2024-02-16 ENCOUNTER — Ambulatory Visit: Payer: Medicare PPO | Admitting: Family

## 2024-03-09 ENCOUNTER — Other Ambulatory Visit: Payer: Self-pay

## 2024-03-10 ENCOUNTER — Other Ambulatory Visit: Payer: Self-pay

## 2024-03-29 ENCOUNTER — Other Ambulatory Visit: Payer: Self-pay

## 2024-03-30 ENCOUNTER — Ambulatory Visit: Payer: Medicare PPO | Admitting: Podiatry

## 2024-03-30 ENCOUNTER — Encounter: Payer: Self-pay | Admitting: Podiatry

## 2024-03-30 DIAGNOSIS — M79674 Pain in right toe(s): Secondary | ICD-10-CM

## 2024-03-30 DIAGNOSIS — B351 Tinea unguium: Secondary | ICD-10-CM | POA: Diagnosis not present

## 2024-03-30 DIAGNOSIS — M79675 Pain in left toe(s): Secondary | ICD-10-CM | POA: Diagnosis not present

## 2024-04-01 ENCOUNTER — Other Ambulatory Visit: Payer: Self-pay

## 2024-04-02 ENCOUNTER — Other Ambulatory Visit: Payer: Self-pay

## 2024-04-04 ENCOUNTER — Encounter: Payer: Self-pay | Admitting: Podiatry

## 2024-04-04 NOTE — Progress Notes (Signed)
 Subjective: Alise Appl presents today referred by Ngetich, Elijio Guadeloupe, NP for complaint of with chief concern of diabetes with elongated, thickened, painful, discolored toenails for months. Aggravating factor(s) include wearing enclosed shoe gear. Patient has tried trimming by family member. She is accompanied by her niece and a caregiver on today's visit. Patient has h/o dementia. Past Medical History:  Diagnosis Date   Arthritis    Arthritis    Chronic pain    Dementia (HCC)    Dyslipidemia    Essential hypertension    Estrogen deficiency    Gait instability    Hypertension    LTBI (latent tuberculosis infection)    Rotator cuff disorder    right   Uveitis      Patient Active Problem List   Diagnosis Date Noted   Edema of extremities 08/09/2018   Chronic diastolic CHF (congestive heart failure) (HCC) 08/09/2018   HLD (hyperlipidemia) 08/09/2018   UTI (urinary tract infection) 04/05/2017   Syncope 04/05/2017   Orthostatic hypotension 04/05/2017   Pruritic rash 04/05/2017   LTBI (latent tuberculosis infection) 01/31/2015   HTN (hypertension) 01/31/2015     Past Surgical History:  Procedure Laterality Date   ABDOMINAL HYSTERECTOMY     total   WRIST SURGERY Right    carpel tunnel yrs ago     Current Outpatient Medications on File Prior to Visit  Medication Sig Dispense Refill   atorvastatin (LIPITOR) 10 MG tablet Take 1 tablet (10 mg total) by mouth at bedtime. 30 tablet 5   donepezil (ARICEPT) 10 MG tablet Take 1 tablet (10 mg total) by mouth every morning. 30 tablet 12   losartan-hydrochlorothiazide (HYZAAR) 50-12.5 MG tablet Take 1 tablet by mouth daily. 30 tablet 5   potassium chloride SA (KLOR-CON M) 20 MEQ tablet Take 1 tablet (20 mEq total) by mouth daily with food. 30 tablet 5   No current facility-administered medications on file prior to visit.     No Known Allergies   Social History   Occupational History    Comment: retired Runner, broadcasting/film/video  Tobacco  Use   Smoking status: Former    Current packs/day: 0.00    Types: Cigarettes    Quit date: 12/24/1971    Years since quitting: 52.3   Smokeless tobacco: Never  Vaping Use   Vaping status: Never Used  Substance and Sexual Activity   Alcohol use: No    Alcohol/week: 0.0 standard drinks of alcohol   Drug use: No   Sexual activity: Not on file     Family History  Problem Relation Age of Onset   Diabetes Mother    Hypertension Mother    Prostate cancer Father    Other Father        heart surgery   Diabetes Sister    Hypertension Brother    Cancer Brother        in 1 brother, unknown type   Heart attack Paternal Grandmother      Immunization History  Administered Date(s) Administered   Fluad Trivalent(High Dose 65+) 09/04/2023   Influenza-Unspecified 01/31/2015   PFIZER(Purple Top)SARS-COV-2 Vaccination 02/05/2020, 03/01/2020, 10/20/2020   PNEUMOCOCCAL CONJUGATE-20 10/13/2023   Pneumococcal Polysaccharide-23 11/24/2020   Pneumococcal-Unspecified 12/23/2013     Objective: There were no vitals filed for this visit.  Bryce Imanni Burdine is a pleasant 83 y.o. female WD, WN in NAD. AAO x 3.  Vascular Examination: Vascular status intact b/l with palpable pedal pulses. Pedal hair present b/l. CFT immediate b/l. No edema. No pain  with calf compression b/l. Skin temperature gradient WNL b/l. No ischemia or gangrene noted b/l LE. No cyanosis or clubbing noted b/l LE.  Neurological Examination: Sensation grossly intact b/l with 10 gram monofilament. Vibratory sensation intact b/l.   Dermatological Examination: Pedal skin with normal turgor, texture and tone b/l. Toenails 1-5 b/l thick, discolored, elongated with subungual debris and pain on dorsal palpation. No hyperkeratotic lesions noted b/l.   Musculoskeletal Examination: Muscle strength 5/5 to b/l LE. No pain, crepitus or joint limitation noted with ROM bilateral LE. No gross bony deformities bilaterally.  Radiographs:  None  Last A1c:      Latest Ref Rng & Units 02/09/2024    1:33 PM  Hemoglobin A1C  Hemoglobin-A1c <5.7 % of total Hgb 5.4    Assessment: 1. Pain due to onychomycosis of toenails of both feet     Plan: -Patient with h/o dementia/Alzheimer's/cognitive deficit. Patient's family member present. All questions/concerns addressed on today's visit. -Patient's family member present. All questions/concerns addressed on today's visit. -Patient to continue soft, supportive shoe gear daily. -Toenails 1-5 b/l were debrided in length and girth with sterile nail nippers and dremel without iatrogenic bleeding.  -Patient/POA to call should there be question/concern in the interim.  Return in about 3 months (around 06/29/2024).  Luella Sager, DPM      [place] LOCATION: [address], [place] [address]                   Office [phone]   [facility] LOCATION: [address] [place], [place] [address] Office [phone]

## 2024-04-05 ENCOUNTER — Other Ambulatory Visit (HOSPITAL_COMMUNITY): Payer: Self-pay

## 2024-04-05 ENCOUNTER — Other Ambulatory Visit: Payer: Self-pay | Admitting: Family

## 2024-04-05 DIAGNOSIS — F03918 Unspecified dementia, unspecified severity, with other behavioral disturbance: Secondary | ICD-10-CM

## 2024-04-14 ENCOUNTER — Other Ambulatory Visit: Payer: Self-pay

## 2024-04-14 ENCOUNTER — Other Ambulatory Visit (HOSPITAL_COMMUNITY): Payer: Self-pay

## 2024-04-14 NOTE — Telephone Encounter (Signed)
 Medication declined by pcp on 04/05/24 as inappropriate refill  Copied from CRM #[id number]. Topic: Clinical - Medication Refill >> Apr 14, 2024 10:48 AM Brynn Caras wrote: Most Recent Primary Care Visit:  Provider: Duncan Gibson  Department: [facility]  Visit Type: OFFICE VISIT  Date: 02/09/2024  Medication: divalproex  (DEPAKOTE ) 125 MG DR tablet  Has the patient contacted their pharmacy? Yes (Agent: If no, request that the patient contact the pharmacy for the refill. If patient does not wish to contact the pharmacy document the reason why and proceed with request.) (Agent: If yes, when and what did the pharmacy advise?)  Is this the correct pharmacy for this prescription? Yes If no, delete pharmacy and type the correct one.  This is the patient's preferred pharmacy:   [facility] [address] [place] [address] Phone: [phone] Fax: [phone]   Has the prescription been filled recently? No  Is the patient out of the medication? Yes  Has the patient been seen for an appointment in the last year OR does the patient have an upcoming appointment? Yes  Can we respond through MyChart? No  Agent: Please be advised that Rx refills may take up to 3 business days. We ask that you follow-up with your pharmacy.

## 2024-04-27 ENCOUNTER — Other Ambulatory Visit: Payer: Self-pay

## 2024-04-28 ENCOUNTER — Other Ambulatory Visit: Payer: Self-pay

## 2024-05-03 ENCOUNTER — Other Ambulatory Visit: Payer: Self-pay

## 2024-05-03 ENCOUNTER — Other Ambulatory Visit (HOSPITAL_COMMUNITY): Payer: Self-pay

## 2024-05-10 ENCOUNTER — Encounter: Payer: Self-pay | Admitting: Family

## 2024-05-10 ENCOUNTER — Ambulatory Visit: Payer: Medicare PPO | Admitting: Family

## 2024-05-10 VITALS — BP 110/70 | Temp 98.2°F | Ht 59.0 in | Wt 184.6 lb

## 2024-05-10 DIAGNOSIS — I5032 Chronic diastolic (congestive) heart failure: Secondary | ICD-10-CM | POA: Diagnosis not present

## 2024-05-10 DIAGNOSIS — I10 Essential (primary) hypertension: Secondary | ICD-10-CM | POA: Diagnosis not present

## 2024-05-10 DIAGNOSIS — E785 Hyperlipidemia, unspecified: Secondary | ICD-10-CM

## 2024-05-10 DIAGNOSIS — E781 Pure hyperglyceridemia: Secondary | ICD-10-CM | POA: Diagnosis not present

## 2024-05-10 LAB — BASIC METABOLIC PANEL WITHOUT GFR
BUN: 10 mg/dL (ref 7–25)
CO2: 28 mmol/L (ref 20–32)
Calcium: 9.2 mg/dL (ref 8.6–10.4)
Chloride: 102 mmol/L (ref 98–110)
Creat: 0.74 mg/dL (ref 0.60–0.95)
Glucose, Bld: 137 mg/dL (ref 65–139)
Potassium: 3.4 mmol/L — ABNORMAL LOW (ref 3.5–5.3)
Sodium: 141 mmol/L (ref 135–146)

## 2024-05-10 LAB — CBC WITH DIFFERENTIAL/PLATELET
Absolute Lymphocytes: 2258 {cells}/uL (ref 850–3900)
Absolute Monocytes: 582 {cells}/uL (ref 200–950)
Basophils Absolute: 28 {cells}/uL (ref 0–200)
Basophils Relative: 0.4 %
Eosinophils Absolute: 149 {cells}/uL (ref 15–500)
Eosinophils Relative: 2.1 %
HCT: 43.3 % (ref 35.0–45.0)
Hemoglobin: 13.6 g/dL (ref 11.7–15.5)
MCH: 27.7 pg (ref 27.0–33.0)
MCHC: 31.4 g/dL — ABNORMAL LOW (ref 32.0–36.0)
MCV: 88.2 fL (ref 80.0–100.0)
MPV: 10.3 fL (ref 7.5–12.5)
Monocytes Relative: 8.2 %
Neutro Abs: 4083 {cells}/uL (ref 1500–7800)
Neutrophils Relative %: 57.5 %
Platelets: 305 10*3/uL (ref 140–400)
RBC: 4.91 10*6/uL (ref 3.80–5.10)
RDW: 13.4 % (ref 11.0–15.0)
Total Lymphocyte: 31.8 %
WBC: 7.1 10*3/uL (ref 3.8–10.8)

## 2024-05-12 ENCOUNTER — Other Ambulatory Visit: Payer: Self-pay

## 2024-05-14 ENCOUNTER — Other Ambulatory Visit: Payer: Self-pay | Admitting: Family

## 2024-05-14 ENCOUNTER — Ambulatory Visit: Payer: Self-pay | Admitting: Family

## 2024-05-14 ENCOUNTER — Other Ambulatory Visit (HOSPITAL_COMMUNITY): Payer: Self-pay

## 2024-05-14 DIAGNOSIS — I10 Essential (primary) hypertension: Secondary | ICD-10-CM

## 2024-05-14 MED ORDER — POTASSIUM CHLORIDE CRYS ER 20 MEQ PO TBCR
20.0000 meq | EXTENDED_RELEASE_TABLET | Freq: Every day | ORAL | 5 refills | Status: DC
  Filled 2024-05-14 – 2024-06-04 (×3): qty 30, 30d supply, fill #0
  Filled 2024-07-13 – 2024-07-26 (×2): qty 30, 30d supply, fill #1
  Filled 2024-08-24: qty 30, 30d supply, fill #2
  Filled 2024-09-16 – 2024-09-21 (×2): qty 30, 30d supply, fill #3
  Filled 2024-10-11 – 2024-10-12 (×2): qty 30, 30d supply, fill #4
  Filled 2024-11-11: qty 30, 30d supply, fill #5

## 2024-05-14 NOTE — Telephone Encounter (Signed)
 Copied from CRM [id number]. Topic: Clinical - Medication Refill >> May 14, 2024 12:33 PM Tiffany H wrote: Medication: Rx #: [id number]  potassium chloride  SA (KLOR-CON  M) 20 MEQ tablet [[id number]]    Has the patient contacted their pharmacy? Yes (Agent: If no, request that the patient contact the pharmacy for the refill. If patient does not wish to contact the pharmacy document the reason why and proceed with request.) (Agent: If yes, when and what did the pharmacy advise?)  This is the patient's preferred pharmacy:  [place] [facility] [address] [place] [address] Phone: [phone] Fax: [phone]  Is this the correct pharmacy for this prescription? Yes If no, delete pharmacy and type the correct one.   Has the prescription been filled recently? No  Is the patient out of the medication? Yes  Has the patient been seen for an appointment in the last year OR does the patient have an upcoming appointment? Yes  Can we respond through MyChart? Yes  Agent: Please be advised that Rx refills may take up to 3 business days. We ask that you follow-up with your pharmacy. >> May 14, 2024 12:37 PM Tiffany H wrote: Patient's niece called to follow up on Dinah's directive to increase Potassium. Please expedite refill per Dinah's directive.

## 2024-05-14 NOTE — Telephone Encounter (Signed)
 Last Fill: 02/04/24  Last OV: 05/10/24 Next OV: 11/08/24  Routing to provider for review/authorization.

## 2024-05-15 ENCOUNTER — Other Ambulatory Visit (HOSPITAL_COMMUNITY): Payer: Self-pay

## 2024-05-16 ENCOUNTER — Other Ambulatory Visit (HOSPITAL_COMMUNITY): Payer: Self-pay

## 2024-05-16 ENCOUNTER — Other Ambulatory Visit: Payer: Self-pay

## 2024-05-17 NOTE — Progress Notes (Signed)
 Provider: Christean Courts FNP-C  Charlie Seda, Elijio Guadeloupe, NP  Patient Care Team: Willim Turnage, Elijio Guadeloupe, NP as PCP - General (Family Medicine)  Extended Emergency Contact Information Primary Emergency Contact: Logan,Sheila  [place] Home Phone: [phone] Relation: Niece Secondary Emergency Contact: Logan,Sheila Mobile Phone: [phone] Relation: Niece  Code Status: Full Code  Goals of care: Advanced Directive information    02/09/2024   12:37 PM  Advanced Directives  Does Patient Have a Medical Advance Directive? Yes  Type of Estate agent of [place];Living will;Out of facility DNR (pink MOST or yellow form)  Does patient want to make changes to medical advance directive? No - Patient declined  Copy of Healthcare Power of Attorney in Chart? No - copy requested     Chief Complaint  Patient presents with   Medical Management of Chronic Issues    3 month follow up. Discuss annual wellness visit, discuss Covid vaccine, tDap and Shinges Vaccine.    Discussed the use of AI scribe software for clinical note transcription with the patient, who gave verbal consent to proceed.  History of Present Illness   Sheila Logan is an 83 year old female with dementia who presents for a three-month follow-up regarding the progression of her condition.  She is experiencing progression in her dementia symptoms, requiring prompting to eat and having difficulty walking straight, often drifting to one side. At home, she uses a mobility walker, but for today's visit, she was brought in a wheelchair due to her mobility issues. Her caregiver is concerned about the progression of her dementia and any additional support that might be needed.  Her eating habits include consuming two waffles, two pieces of bacon, and some fruit cups in the morning. She can eat by herself but needs reminders that there is food in front of her. There is no need to prompt her to  swallow.  She experiences occasional knee pain, particularly in the morning, and her ankles and legs sometimes swell. She wears support stockings during the day and elevates her legs at night. She received a knee injection from an orthopedic specialist since her last visit, which has reduced her complaints of knee pain.  She has a history of congestive heart failure. Her breathing becomes labored after about ten steps, and she does cough up phlegm. Her caregiver notes that her left leg swells more than the right.  Her current medications include donepezil , atorvastatin , losartan , hydrochlorothiazide , and potassium supplements. There have been no changes in her medication regimen.  No pain, changes in urine color or smell, or significant weakness. She has no recent urinary tract infections. Her caregiver notes some redness between her legs and on her bottom, which is managed with barrier creams.    Past Medical History:  Diagnosis Date   Arthritis    Arthritis    Chronic pain    Dementia (HCC)    Dyslipidemia    Essential hypertension    Estrogen deficiency    Gait instability    Hypertension    LTBI (latent tuberculosis infection)    Rotator cuff disorder    right   Uveitis    Past Surgical History:  Procedure Laterality Date   ABDOMINAL HYSTERECTOMY     total   WRIST SURGERY Right    carpel tunnel yrs ago    No Known Allergies  Outpatient Encounter Medications as of 05/10/2024  Medication Sig   atorvastatin  (LIPITOR) 10 MG tablet Take 1 tablet (10 mg total) by  mouth at bedtime.   donepezil  (ARICEPT ) 10 MG tablet Take 1 tablet (10 mg total) by mouth every morning.   losartan -hydrochlorothiazide  (HYZAAR ) 50-12.5 MG tablet Take 1 tablet by mouth daily.   [DISCONTINUED] potassium chloride  SA (KLOR-CON  M) 20 MEQ tablet Take 1 tablet (20 mEq total) by mouth daily with food.   No facility-administered encounter medications on file as of 05/10/2024.    Review of Systems   Constitutional:  Negative for appetite change, chills, fatigue, fever and unexpected weight change.  HENT:  Negative for congestion, dental problem, ear discharge, ear pain, facial swelling, hearing loss, nosebleeds, postnasal drip, rhinorrhea, sinus pressure, sinus pain, sneezing, sore throat, tinnitus and trouble swallowing.   Eyes:  Negative for pain, discharge, redness, itching and visual disturbance.  Respiratory:  Negative for cough, chest tightness and wheezing.        Dyspnea on exertion   Cardiovascular:  Negative for chest pain, palpitations and leg swelling.  Gastrointestinal:  Negative for abdominal distention, abdominal pain, blood in stool, constipation, diarrhea, nausea and vomiting.  Endocrine: Negative for cold intolerance, heat intolerance, polydipsia, polyphagia and polyuria.  Genitourinary:  Negative for difficulty urinating, dysuria, flank pain, frequency and urgency.  Musculoskeletal:  Positive for arthralgias and gait problem. Negative for back pain, joint swelling, myalgias, neck pain and neck stiffness.       Knee pain   Skin:  Negative for color change, pallor, rash and wound.  Neurological:  Negative for dizziness, syncope, speech difficulty, weakness, light-headedness, numbness and headaches.  Hematological:  Does not bruise/bleed easily.  Psychiatric/Behavioral:  Negative for agitation, behavioral problems, confusion, hallucinations, self-injury, sleep disturbance and suicidal ideas. The patient is not nervous/anxious.     Immunization History  Administered Date(s) Administered   Fluad Trivalent(High Dose 65+) 09/04/2023   Influenza-Unspecified 01/31/2015   PFIZER(Purple Top)SARS-COV-2 Vaccination 02/05/2020, 03/01/2020, 10/20/2020   PNEUMOCOCCAL CONJUGATE-20 10/13/2023   Pneumococcal Polysaccharide-23 11/24/2020   Pneumococcal-Unspecified 12/23/2013   Pertinent  Health Maintenance Due  Topic Date Due   INFLUENZA VACCINE  07/23/2024   DEXA SCAN  Completed       05/21/2017   10:10 AM 09/04/2023    1:01 PM 10/13/2023    2:16 PM 10/29/2023    2:56 PM 02/09/2024   12:37 PM  Fall Risk  Falls in the past year? Yes 0 0 0 0  Was there an injury with Fall? Yes  0 0 0  Was there an injury with Fall? - Comments hit head- "knot still there"      Fall Risk Category Calculator   0 0 0  Patient at Risk for Falls Due to Impaired balance/gait  No Fall Risks No Fall Risks No Fall Risks  Fall risk Follow up   Falls evaluation completed  Falls evaluation completed;Education provided;Falls prevention discussed   Functional Status Survey:    Vitals:   05/10/24 1418  BP: 110/70  Temp: 98.2 F (36.8 C)  SpO2: 98%  Weight: 184 lb 9.6 oz (83.7 kg)  Height: 4\' 11"  (1.499 m)   Body mass index is 37.28 kg/m. Physical Exam VITALS: T- 97.2, P- 68, BP- 110/70, SaO2- 93% MEASUREMENTS: Weight- 184. GENERAL: Alert, cooperative, well developed, no acute distress HEENT: Normocephalic, normal oropharynx, moist mucous membranes, ears and nose normal CHEST: Clear to auscultation bilaterally, no wheezes, rhonchi, or crackles CARDIOVASCULAR: Normal heart rate and rhythm, S1 and S2 normal without murmurs ABDOMEN: Soft, non-tender, non-distended, without organomegaly, normal bowel sounds EXTREMITIES: No cyanosis or edema, normal with compression stockings NEUROLOGICAL:  Cranial nerves grossly intact, moves all extremities without gross motor or sensory deficit, proprioception normal, motor function normal SKIN: Intact without rash or sores   Labs reviewed: Recent Labs    09/04/23 1440 02/09/24 1333 05/10/24 1455  NA 140 145 141  K 3.6 3.8 3.4*  CL 102 105 102  CO2 27 28 28   GLUCOSE 82 88 137  BUN 7 12 10   CREATININE 0.89 0.72 0.74  CALCIUM  8.7 9.2 9.2   Recent Labs    07/17/23 1526 09/04/23 1440 02/09/24 1333  AST 18 13 12   ALT 14 12 10   ALKPHOS 92  --   --   BILITOT 1.5* 0.8 1.0  PROT 7.7 6.7 7.1  ALBUMIN 3.7  --   --    Recent Labs     09/04/23 1440 02/09/24 1333 05/10/24 1455  WBC 6.7 6.8 7.1  NEUTROABS 3,243 3,951 4,083  HGB 13.5 13.9 13.6  HCT 41.4 42.3 43.3  MCV 85.5 87.4 88.2  PLT 288 279 305   Lab Results  Component Value Date   TSH 2.42 02/09/2024   Lab Results  Component Value Date   HGBA1C 5.4 02/09/2024   Lab Results  Component Value Date   CHOL 149 02/09/2024   HDL 49 (L) 02/09/2024   LDLCALC 74 02/09/2024   TRIG 166 (H) 02/09/2024   CHOLHDL 3.0 02/09/2024    Significant Diagnostic Results in last 30 days:  No results found.  Assessment/Plan  Congestive Heart Failure Congestive heart failure with symptoms of dyspnea and left leg edema, consistent with fluid retention. No cough or significant respiratory distress. Compression stockings and leg elevation are beneficial. - Order BMP and CBC to assess for infection or electrolyte imbalance. - Continue use of compression stockings. - Encourage leg elevation and use of a recliner.  Dementia Progression of dementia with increased need for prompting during meals and difficulty ambulating. No significant agitation; cooperative without medication. Continues to follow instructions, indicating she is not in the end stage. Encouraged to stay active to prevent further decline. - Encourage regular physical activity, including walking and leg exercises. - Monitor cognitive function and ability to follow instructions.  Hypertension Hypertension is well-controlled at 110/70 mmHg on losartan  and hydrochlorothiazide . - Continue losartan  and hydrochlorothiazide .   Family/ staff Communication: Reviewed plan of care with patient and niece verbalized understanding  Labs/tests ordered:  - CBC/diff - BMP  Next Appointment: Return in about 6 months (around 11/10/2024) for medical mangement of chronic issues..   Total time: 20 minutes. Greater than 50% of total time spent doing patient education regarding CHF,HTN,Dementia, health maintenance including  symptom/medication management.   Estil Heman, NP

## 2024-05-20 ENCOUNTER — Other Ambulatory Visit: Payer: Self-pay

## 2024-06-04 ENCOUNTER — Other Ambulatory Visit: Payer: Self-pay

## 2024-06-04 ENCOUNTER — Other Ambulatory Visit (HOSPITAL_COMMUNITY): Payer: Self-pay

## 2024-06-14 ENCOUNTER — Other Ambulatory Visit: Payer: Self-pay

## 2024-06-30 ENCOUNTER — Other Ambulatory Visit: Payer: Self-pay

## 2024-07-13 ENCOUNTER — Other Ambulatory Visit: Payer: Self-pay

## 2024-07-13 ENCOUNTER — Ambulatory Visit (INDEPENDENT_AMBULATORY_CARE_PROVIDER_SITE_OTHER): Admitting: Podiatry

## 2024-07-13 DIAGNOSIS — Z91198 Patient's noncompliance with other medical treatment and regimen for other reason: Secondary | ICD-10-CM

## 2024-07-13 NOTE — Progress Notes (Signed)
 Failure to attend appointment with reason given Canceled and rescheduled.

## 2024-07-14 ENCOUNTER — Ambulatory Visit: Admitting: Podiatry

## 2024-07-14 ENCOUNTER — Other Ambulatory Visit: Payer: Self-pay

## 2024-07-14 ENCOUNTER — Encounter: Payer: Self-pay | Admitting: Podiatry

## 2024-07-14 ENCOUNTER — Other Ambulatory Visit (HOSPITAL_COMMUNITY): Payer: Self-pay

## 2024-07-14 DIAGNOSIS — B351 Tinea unguium: Secondary | ICD-10-CM

## 2024-07-14 DIAGNOSIS — B353 Tinea pedis: Secondary | ICD-10-CM

## 2024-07-14 DIAGNOSIS — M79674 Pain in right toe(s): Secondary | ICD-10-CM | POA: Diagnosis not present

## 2024-07-14 DIAGNOSIS — M79675 Pain in left toe(s): Secondary | ICD-10-CM

## 2024-07-15 ENCOUNTER — Other Ambulatory Visit: Payer: Self-pay

## 2024-07-16 ENCOUNTER — Other Ambulatory Visit: Payer: Self-pay

## 2024-07-18 NOTE — Progress Notes (Signed)
  Subjective:  Patient ID: Sheila Logan, female    DOB: [birth year],  MRN: [id number]  Sheila Logan presents to clinic today for painful thick toenails that are difficult to trim. Pain interferes with ambulation. Aggravating factors include wearing enclosed shoe gear. Pain is relieved with periodic professional debridement. She is accompanied by her caregiver and family on today's visit. Chief Complaint  Patient presents with   RFC     RFC Non diabetic toenail trim. LOV with PCP 06/2024.   New problem(s): None.   PCP is Ngetich, Roxan BROCKS, NP.  No Known Allergies  Review of Systems: Negative except as noted in the HPI.  Objective: No changes noted in today's physical examination. There were no vitals filed for this visit. Sheila Logan is a pleasant 82 y.o. female in NAD. AAO x 2.  Vascular Examination: Vascular status intact b/l with palpable pedal pulses. Pedal hair present b/l. CFT immediate b/l. No edema. No pain with calf compression b/l. Skin temperature gradient WNL b/l. No ischemia or gangrene noted b/l LE. No cyanosis or clubbing noted b/l LE.  Neurological Examination: Sensation grossly intact b/l with 10 gram monofilament. Vibratory sensation intact b/l.   Dermatological Examination: Pedal skin with normal turgor, texture and tone b/l. Toenails 1-5 b/l thick, discolored, elongated with subungual debris and pain on dorsal palpation. No hyperkeratotic lesions noted b/l.  Interdigital maceration noted bilateral 1st-4th webspace(s). No blistering, no weeping, no open wounds.  Musculoskeletal Examination: Muscle strength 5/5 to b/l LE. No pain, crepitus or joint limitation noted with ROM bilateral LE. No gross bony deformities bilaterally.  Radiographs: None  Assessment/Plan: 1. Pain due to onychomycosis of toenails of both feet   2. Tinea pedis of both feet   -Caregiver/provider present with patient on today's visit. -Patient's family member  present. All questions/concerns addressed on today's visit. -Patient to continue soft, supportive shoe gear daily. -Toenails 1-5 b/l were debrided in length and girth with sterile nail nippers without iatrogenic bleeding. Patient declined dremel. -Continue Lotrimin Spray powder between toes once daily. -Patient/POA to call should there be question/concern in the interim.   Return in about 3 months (around 10/14/2024).  Delon LITTIE Merlin, DPM      [place] LOCATION: [address], [place] [address]                   Office [phone]   [facility] LOCATION: [address] [place], [place] [address] Office [phone]

## 2024-07-20 ENCOUNTER — Other Ambulatory Visit: Payer: Self-pay

## 2024-07-26 ENCOUNTER — Other Ambulatory Visit: Payer: Self-pay

## 2024-07-26 ENCOUNTER — Other Ambulatory Visit (HOSPITAL_COMMUNITY): Payer: Self-pay

## 2024-07-27 ENCOUNTER — Other Ambulatory Visit: Payer: Self-pay

## 2024-07-29 ENCOUNTER — Other Ambulatory Visit: Payer: Self-pay

## 2024-08-13 ENCOUNTER — Other Ambulatory Visit: Payer: Self-pay

## 2024-08-16 ENCOUNTER — Other Ambulatory Visit: Payer: Self-pay | Admitting: Family

## 2024-08-16 ENCOUNTER — Other Ambulatory Visit: Payer: Self-pay

## 2024-08-16 ENCOUNTER — Other Ambulatory Visit (HOSPITAL_COMMUNITY): Payer: Self-pay

## 2024-08-16 DIAGNOSIS — I10 Essential (primary) hypertension: Secondary | ICD-10-CM

## 2024-08-16 DIAGNOSIS — E785 Hyperlipidemia, unspecified: Secondary | ICD-10-CM

## 2024-08-16 MED ORDER — ATORVASTATIN CALCIUM 10 MG PO TABS
10.0000 mg | ORAL_TABLET | Freq: Every evening | ORAL | 3 refills | Status: DC
  Filled 2024-08-24: qty 30, 30d supply, fill #0
  Filled 2024-09-16 – 2024-09-21 (×2): qty 30, 30d supply, fill #1
  Filled 2024-10-11 – 2024-10-12 (×2): qty 30, 30d supply, fill #2
  Filled 2024-11-11: qty 30, 30d supply, fill #3

## 2024-08-16 MED ORDER — LOSARTAN POTASSIUM-HCTZ 50-12.5 MG PO TABS
1.0000 | ORAL_TABLET | Freq: Every day | ORAL | 3 refills | Status: DC
  Filled 2024-08-24: qty 30, 30d supply, fill #0
  Filled 2024-09-16 – 2024-09-21 (×2): qty 30, 30d supply, fill #1
  Filled 2024-10-11 – 2024-10-12 (×2): qty 30, 30d supply, fill #2
  Filled 2024-11-11: qty 30, 30d supply, fill #3

## 2024-08-24 ENCOUNTER — Other Ambulatory Visit: Payer: Self-pay

## 2024-08-25 ENCOUNTER — Other Ambulatory Visit: Payer: Self-pay

## 2024-09-14 ENCOUNTER — Other Ambulatory Visit (HOSPITAL_COMMUNITY): Payer: Self-pay

## 2024-09-14 ENCOUNTER — Other Ambulatory Visit: Payer: Self-pay | Admitting: Family

## 2024-09-14 ENCOUNTER — Other Ambulatory Visit: Payer: Self-pay

## 2024-09-14 DIAGNOSIS — F03918 Unspecified dementia, unspecified severity, with other behavioral disturbance: Secondary | ICD-10-CM

## 2024-09-14 MED ORDER — DONEPEZIL HCL 10 MG PO TABS
10.0000 mg | ORAL_TABLET | Freq: Every morning | ORAL | 12 refills | Status: AC
  Filled 2024-09-14 – 2024-09-21 (×3): qty 30, 30d supply, fill #0
  Filled 2024-10-11 – 2024-10-12 (×2): qty 30, 30d supply, fill #1
  Filled 2024-11-11: qty 30, 30d supply, fill #2
  Filled 2024-12-20: qty 30, 30d supply, fill #3
  Filled 2025-01-18: qty 30, 30d supply, fill #4

## 2024-09-15 ENCOUNTER — Other Ambulatory Visit: Payer: Self-pay

## 2024-09-15 ENCOUNTER — Other Ambulatory Visit (HOSPITAL_COMMUNITY): Payer: Self-pay

## 2024-09-16 ENCOUNTER — Other Ambulatory Visit (HOSPITAL_COMMUNITY): Payer: Self-pay

## 2024-09-16 ENCOUNTER — Other Ambulatory Visit: Payer: Self-pay

## 2024-09-17 ENCOUNTER — Other Ambulatory Visit: Payer: Self-pay

## 2024-09-21 ENCOUNTER — Other Ambulatory Visit: Payer: Self-pay

## 2024-09-22 ENCOUNTER — Other Ambulatory Visit: Payer: Self-pay

## 2024-10-11 ENCOUNTER — Other Ambulatory Visit: Payer: Self-pay

## 2024-10-12 ENCOUNTER — Other Ambulatory Visit: Payer: Self-pay

## 2024-10-13 ENCOUNTER — Other Ambulatory Visit: Payer: Self-pay

## 2024-10-14 ENCOUNTER — Other Ambulatory Visit: Payer: Self-pay

## 2024-10-18 ENCOUNTER — Other Ambulatory Visit: Payer: Self-pay

## 2024-10-19 ENCOUNTER — Ambulatory Visit (INDEPENDENT_AMBULATORY_CARE_PROVIDER_SITE_OTHER): Admitting: Podiatry

## 2024-10-19 DIAGNOSIS — Z91198 Patient's noncompliance with other medical treatment and regimen for other reason: Secondary | ICD-10-CM

## 2024-10-19 NOTE — Progress Notes (Addendum)
 1. Failure to attend appointment with reason given    Rescheduled by patient.

## 2024-10-20 ENCOUNTER — Other Ambulatory Visit: Payer: Self-pay

## 2024-10-22 ENCOUNTER — Other Ambulatory Visit: Payer: Self-pay

## 2024-10-25 ENCOUNTER — Encounter: Payer: Self-pay | Admitting: Radiology

## 2024-11-08 ENCOUNTER — Ambulatory Visit: Payer: Self-pay | Admitting: Family

## 2024-11-08 ENCOUNTER — Encounter: Payer: Self-pay | Admitting: Family

## 2024-11-08 VITALS — BP 116/70 | HR 74 | Temp 98.0°F | Resp 19

## 2024-11-08 DIAGNOSIS — F03918 Unspecified dementia, unspecified severity, with other behavioral disturbance: Secondary | ICD-10-CM | POA: Diagnosis not present

## 2024-11-08 DIAGNOSIS — R55 Syncope and collapse: Secondary | ICD-10-CM

## 2024-11-08 DIAGNOSIS — E785 Hyperlipidemia, unspecified: Secondary | ICD-10-CM

## 2024-11-08 DIAGNOSIS — R7303 Prediabetes: Secondary | ICD-10-CM

## 2024-11-08 DIAGNOSIS — I10 Essential (primary) hypertension: Secondary | ICD-10-CM | POA: Diagnosis not present

## 2024-11-08 DIAGNOSIS — I5032 Chronic diastolic (congestive) heart failure: Secondary | ICD-10-CM

## 2024-11-08 DIAGNOSIS — Z23 Encounter for immunization: Secondary | ICD-10-CM | POA: Diagnosis not present

## 2024-11-08 NOTE — Progress Notes (Signed)
 Provider: Roxan Plough FNP-C   Jeff Frieden, Roxan BROCKS, NP  Patient Care Team: Allin Frix, Roxan BROCKS, NP as PCP - General (Family Medicine)  Extended Emergency Contact Information Primary Emergency Contact: Winstead,Detta  [place] Home Phone: [phone] Relation: Niece Secondary Emergency Contact: Goins,Raven Mobile Phone: [phone] Relation: Niece  Code Status: Full code Goals of care: Advanced Directive information    11/08/2024    2:04 PM  Advanced Directives  Does Patient Have a Medical Advance Directive? Yes  Type of Advance Directive Healthcare Power of Attorney  Does patient want to make changes to medical advance directive? No - Patient declined  Copy of Healthcare Power of Attorney in Chart? Yes - validated most recent copy scanned in chart (See row information)     Chief Complaint  Patient presents with   Medical Management of Chronic Issues    6 Month follow up.     Discussed the use of AI scribe software for clinical note transcription with the patient, who gave verbal consent to proceed.  History of Present Illness   Sheila Logan is an 83 year old female with essential hypertension, hyperlipidemia, chronic diastolic congestive heart failure, and dementia with behavioral disturbance who presents for a six-month follow-up.  She is currently on atorvastatin  10 mg daily for hyperlipidemia, losartan /hydrochlorothiazide  50/12.5 mg for blood pressure, donepezil  10 mg daily for dementia, and potassium chloride  20 mEq daily for hypokalemia.  Her dementia is progressing, requiring prompting to eat and take medications. She experiences episodes where her head drops, and she becomes unresponsive for 10-15 minutes, requiring cold towels and verbal stimulation to recover. These episodes are not associated with seizure activity. Prior evaluations, including cardiology and MRI, have been unremarkable.  No current issues with rashes or itching. No pain,  constipation, or urinary symptoms.  Her last lab work six months ago showed an A1c of 5.4, normal CBC, CMP, and thyroid  levels, with a lipid panel showing total cholesterol of 149, HDL 49, triglycerides 166, and LDL 74. She is due for repeat lab work.    Past Medical History:  Diagnosis Date   Arthritis    Arthritis    Chronic pain    Dementia (HCC)    Dyslipidemia    Essential hypertension    Estrogen deficiency    Gait instability    Hypertension    LTBI (latent tuberculosis infection)    Rotator cuff disorder    right   Uveitis    Past Surgical History:  Procedure Laterality Date   ABDOMINAL HYSTERECTOMY     total   WRIST SURGERY Right    carpel tunnel yrs ago    No Known Allergies  Allergies as of 11/08/2024   No Known Allergies      Medication List        Accurate as of November 08, 2024  4:39 PM. If you have any questions, ask your nurse or doctor.          atorvastatin  10 MG tablet Commonly known as: LIPITOR Take 1 tablet (10 mg total) by mouth at bedtime.   donepezil  10 MG tablet Commonly known as: ARICEPT  Take 1 tablet (10 mg total) by mouth every morning.   losartan -hydrochlorothiazide  50-12.5 MG tablet Commonly known as: HYZAAR  Take 1 tablet by mouth daily.   potassium chloride  SA 20 MEQ tablet Commonly known as: KLOR-CON  M Take 1 tablet (20 mEq total) by mouth daily with food.        Review of Systems  Unable to perform ROS: Dementia (Information provided by patient's POA)  Constitutional:  Negative for appetite change, chills, fatigue, fever and unexpected weight change.  HENT:  Negative for congestion, dental problem, ear discharge, ear pain, facial swelling, hearing loss, nosebleeds, postnasal drip, rhinorrhea, sinus pressure, sinus pain, sneezing, sore throat, tinnitus and trouble swallowing.   Eyes:  Negative for pain, discharge, redness, itching and visual disturbance.  Respiratory:  Negative for cough, chest tightness,  shortness of breath and wheezing.   Cardiovascular:  Negative for chest pain, palpitations and leg swelling.  Gastrointestinal:  Negative for abdominal distention, abdominal pain, blood in stool, constipation, diarrhea, nausea and vomiting.  Endocrine: Negative for cold intolerance, heat intolerance, polydipsia, polyphagia and polyuria.  Genitourinary:  Negative for difficulty urinating, dysuria, flank pain, frequency and urgency.  Musculoskeletal:  Positive for gait problem. Negative for arthralgias, back pain, joint swelling, myalgias, neck pain and neck stiffness.  Skin:  Negative for color change, pallor, rash and wound.  Neurological:  Negative for dizziness, syncope, speech difficulty, weakness, light-headedness, numbness and headaches.  Hematological:  Does not bruise/bleed easily.  Psychiatric/Behavioral:  Negative for agitation, behavioral problems, confusion, hallucinations, self-injury, sleep disturbance and suicidal ideas. The patient is not nervous/anxious.     Immunization History  Administered Date(s) Administered   Fluad Trivalent(High Dose 65+) 09/04/2023   INFLUENZA, HIGH DOSE SEASONAL PF 11/08/2024   Influenza-Unspecified 01/31/2015   PFIZER(Purple Top)SARS-COV-2 Vaccination 02/05/2020, 03/01/2020, 10/20/2020   PNEUMOCOCCAL CONJUGATE-20 10/13/2023   Pneumococcal Polysaccharide-23 11/24/2020   Pneumococcal-Unspecified 12/23/2013   Pertinent  Health Maintenance Due  Topic Date Due   Influenza Vaccine  Completed   DEXA SCAN  Completed      09/04/2023    1:01 PM 10/13/2023    2:16 PM 10/29/2023    2:56 PM 02/09/2024   12:37 PM 11/08/2024    2:03 PM  Fall Risk  Falls in the past year? 0 0 0 0 0  Was there an injury with Fall?  0 0 0 0  Fall Risk Category Calculator  0 0 0 0  Patient at Risk for Falls Due to  No Fall Risks No Fall Risks No Fall Risks No Fall Risks  Fall risk Follow up  Falls evaluation completed  Falls evaluation completed;Education provided;Falls  prevention discussed Falls evaluation completed   Functional Status Survey:    Vitals:   11/08/24 1405  BP: 116/70  Pulse: 74  Resp: 19  Temp: 98 F (36.7 C)  SpO2: 99%   There is no height or weight on file to calculate BMI. Physical Exam  VITALS: P- 74, BP- 116/70, RR- 98, SaO2- 99% GENERAL: Alert, cooperative, well developed, no acute distress HEENT: Normocephalic, normal oropharynx, moist mucous membranes, ears normal, nose normal, no sinus tenderness NECK: Thyroid  normal CHEST: Clear to auscultation bilaterally, no wheezes, rhonchi, or crackles CARDIOVASCULAR: Normal heart rate and rhythm, S1 and S2 normal without murmurs ABDOMEN: Soft, non-tender, non-distended, without organomegaly, normal bowel sounds EXTREMITIES: No cyanosis or edema, extremities normal NEUROLOGICAL: Cranial nerves grossly intact, moves all extremities without gross motor or sensory deficit  SKIN: No rash,no lesion or erythema   PSYCHIATRY/BEHAVIORAL: Mood stable    Labs reviewed: Recent Labs    02/09/24 1333 05/10/24 1455  NA 145 141  K 3.8 3.4*  CL 105 102  CO2 28 28  GLUCOSE 88 137  BUN 12 10  CREATININE 0.72 0.74  CALCIUM  9.2 9.2   Recent Labs    02/09/24 1333  AST 12  ALT 10  BILITOT 1.0  PROT 7.1   Recent Labs    02/09/24 1333 05/10/24 1455  WBC 6.8 7.1  NEUTROABS 3,951 4,083  HGB 13.9 13.6  HCT 42.3 43.3  MCV 87.4 88.2  PLT 279 305   Lab Results  Component Value Date   TSH 2.42 02/09/2024   Lab Results  Component Value Date   HGBA1C 5.4 02/09/2024   Lab Results  Component Value Date   CHOL 149 02/09/2024   HDL 49 (L) 02/09/2024   LDLCALC 74 02/09/2024   TRIG 166 (H) 02/09/2024   CHOLHDL 3.0 02/09/2024    Significant Diagnostic Results in last 30 days:  No results found.  Assessment/Plan  Essential hypertension Blood pressure is well-controlled at 116/70 mmHg with current medication regimen. - Continue losartan  and hydrochlorothiazide  50/12.5 mg  daily.  Hyperlipidemia Triglycerides elevated at 166 mg/dL, LDL cholesterol at 74 mg/dL. Dietary modifications discussed to reduce triglycerides. - Continue atorvastatin  10 mg daily. - Repeated lipid panel to monitor triglyceride levels.  Chronic diastolic congestive heart failure No acute exacerbations. Cardiologist evaluation and imaging normal. - Continue current management.  Dementia with behavioral disturbance Progression with increased need for prompting with eating and medication adherence. Episodes of clear speech followed by unresponsiveness suggest possible syncope. - Continue donepezil  10 mg daily. - Ensure feeding in an upright position and prompt medication adherence.  Episodes of transient unresponsiveness with labored breathing (suspected syncope) Episodes characterized by unresponsiveness and labored breathing, resolving with cold towels and verbal stimulation. No seizure activity. Cardiologist evaluation and imaging normal. - Continue to monitor episodes and manage with cold towels and verbal stimulation.  General Health Maintenance Due for shingles, influenza, tdap, and COVID vaccines. Influenza vaccine recommended today. Discussed obtaining other vaccines at the pharmacy. - Administered influenza vaccine today. - Advised obtaining shingles, tdap, and COVID vaccines at the pharmacy.   Family/ staff Communication: Reviewed plan of care with patient verbalized understanding   Labs/tests ordered:  - CBC with Differential/Platelet - CMP with eGFR([facility]) - TSH - Hgb A1C - Lipid panel   Next Appointment : Return in about 6 months (around 05/08/2025) for medical mangement of chronic issues.SABRA   Spent 30 minutes of Face to face and non-face to face with patient  >50% time spent counseling; reviewing medical record; tests; labs; documentation and developing future plan of care.   Roxan JAYSON Plough, NP

## 2024-11-08 NOTE — Patient Instructions (Signed)
 1.Stop at check out & schedule Annual Wellness Visit.( Video)

## 2024-11-09 ENCOUNTER — Ambulatory Visit: Admitting: Podiatry

## 2024-11-09 LAB — HEMOGLOBIN A1C
Hgb A1c MFr Bld: 5.5 % (ref <5.7)
Mean Plasma Glucose: 111 mg/dL
eAG (mmol/L): 6.2 mmol/L

## 2024-11-09 LAB — LIPID PANEL
Cholesterol: 141 mg/dL (ref <200)
HDL: 50 mg/dL (ref >=50)
LDL Cholesterol (Calc): 69 mg/dL
Non-HDL Cholesterol (Calc): 91 mg/dL (ref <130)
Total CHOL/HDL Ratio: 2.8 (calc) (ref <5.0)
Triglycerides: 141 mg/dL (ref <150)

## 2024-11-09 LAB — COMPLETE METABOLIC PANEL WITHOUT GFR
AG Ratio: 1.2 (calc) (ref 1.0–2.5)
ALT: 10 U/L (ref 6–29)
AST: 13 U/L (ref 10–35)
Albumin: 3.9 g/dL (ref 3.6–5.1)
Alkaline phosphatase (APISO): 87 U/L (ref 37–153)
BUN/Creatinine Ratio: 11 (calc) (ref 6–22)
BUN: 11 mg/dL (ref 7–25)
CO2: 28 mmol/L (ref 20–32)
Calcium: 9.3 mg/dL (ref 8.6–10.4)
Chloride: 102 mmol/L (ref 98–110)
Creat: 1.04 mg/dL — ABNORMAL HIGH (ref 0.60–0.95)
Globulin: 3.2 g/dL (ref 1.9–3.7)
Glucose, Bld: 100 mg/dL (ref 65–139)
Potassium: 3.8 mmol/L (ref 3.5–5.3)
Sodium: 141 mmol/L (ref 135–146)
Total Bilirubin: 1 mg/dL (ref 0.2–1.2)
Total Protein: 7.1 g/dL (ref 6.1–8.1)

## 2024-11-09 LAB — CBC WITH DIFFERENTIAL/PLATELET
Absolute Lymphocytes: 1788 {cells}/uL (ref 850–3900)
Absolute Monocytes: 572 {cells}/uL (ref 200–950)
Basophils Absolute: 30 {cells}/uL (ref 0–200)
Basophils Relative: 0.5 %
Eosinophils Absolute: 100 {cells}/uL (ref 15–500)
Eosinophils Relative: 1.7 %
HCT: 44.4 % (ref 35.0–45.0)
Hemoglobin: 14.4 g/dL (ref 11.7–15.5)
MCH: 28 pg (ref 27.0–33.0)
MCHC: 32.4 g/dL (ref 32.0–36.0)
MCV: 86.2 fL (ref 80.0–100.0)
MPV: 10.5 fL (ref 7.5–12.5)
Monocytes Relative: 9.7 %
Neutro Abs: 3410 {cells}/uL (ref 1500–7800)
Neutrophils Relative %: 57.8 %
Platelets: 331 Thousand/uL (ref 140–400)
RBC: 5.15 Million/uL — ABNORMAL HIGH (ref 3.80–5.10)
RDW: 13.6 % (ref 11.0–15.0)
Total Lymphocyte: 30.3 %
WBC: 5.9 Thousand/uL (ref 3.8–10.8)

## 2024-11-09 LAB — TSH: TSH: 2.43 m[IU]/L (ref 0.40–4.50)

## 2024-11-11 ENCOUNTER — Other Ambulatory Visit: Payer: Self-pay

## 2024-11-12 ENCOUNTER — Other Ambulatory Visit: Payer: Self-pay

## 2024-11-15 ENCOUNTER — Ambulatory Visit: Admitting: Family

## 2024-11-15 ENCOUNTER — Other Ambulatory Visit: Payer: Self-pay

## 2024-11-17 ENCOUNTER — Ambulatory Visit: Admitting: Family

## 2024-11-26 ENCOUNTER — Ambulatory Visit: Payer: Self-pay | Admitting: Family

## 2024-11-29 ENCOUNTER — Encounter: Payer: Self-pay | Admitting: Family

## 2024-11-29 ENCOUNTER — Ambulatory Visit: Admitting: Family

## 2024-11-29 DIAGNOSIS — Z0000 Encounter for general adult medical examination without abnormal findings: Secondary | ICD-10-CM

## 2024-11-29 NOTE — Progress Notes (Signed)
 This service is provided via telemedicine  No vital signs collected/recorded due to the encounter was a telemedicine visit.   Location of patient (ex: home, work):  Home   Patient consents to a telephone visit:  Yes, 11/29/24   Location of the provider (ex: office, home):  [facility]  Name of any referring provider: Ngetich, Roxan BROCKS, NP   Names of all persons participating in the telemedicine service and their role in the encounter: Nigil Braman B/CMA, Ngetich, Dinah C, NP , and patient  Time spent on call:  11 minutes       Chief Complaint  Patient presents with   Medicare Wellness     ANNUAL WELL VISIT, SEQUENTIAL     Subjective:   Sheila Logan is a 83 y.o. female who presents for a [employer] Visit.  Visit info / Clinical Intake: Medicare Wellness Visit Type:: Subsequent Annual Wellness Visit Persons participating in visit and providing information:: patient & caregiver Medicare Wellness Visit Mode:: Video Since this visit was completed virtually, some vitals may be partially provided or unavailable. Missing vitals are due to the limitations of the virtual format.: Documented vitals are patient reported If Telephone or Video please confirm:: I connected with patient using audio/video enable telemedicine. I verified patient identity with two identifiers, discussed telehealth limitations, and patient agreed to proceed. Patient Location:: Home Provider Location:: Office Interpreter Needed?: No Pre-visit prep was completed: yes AWV questionnaire completed by patient prior to visit?: yes Date:: 11/29/24 Living arrangements:: with family/others Patient's Overall Health Status Rating: good Typical amount of pain: none Does pain affect daily life?: no Are you currently prescribed opioids?: no  Dietary Habits and Nutritional Risks How many meals a day?: 2 Eats fruit and vegetables daily?: yes Most meals are obtained by:  having others provide food In the last 2 weeks, have you had any of the following?: none Diabetic:: no  Functional Status Activities of Daily Living (to include ambulation/medication): (!) Dependent Feeding: Needs assistance Dressing/Grooming: Dependent Bathing: Dependent Toileting: Dependent Transfer: Dependent Ambulation: Dependent Medication Administration: Dependent Home Management (perform basic housework or laundry): Dependent Manage your own finances?: (!) no (Niece assist) Primary transportation is: family / friends Concerns about vision?: no *vision screening is required for WTM* Concerns about hearing?: no  Fall Screening Falls in the past year?: 0 Number of falls in past year: 0 Was there an injury with Fall?: 0 Fall Risk Category Calculator: 0 Patient Fall Risk Level: Low Fall Risk  Fall Risk Patient at Risk for Falls Due to: Impaired balance/gait Fall risk Follow up: Falls prevention discussed  Home and Transportation Safety: All rugs have non-skid backing?: N/A, no rugs All stairs or steps have railings?: yes Grab bars in the bathtub or shower?: yes Have non-skid surface in bathtub or shower?: (!) no (Patient bas chair in the shower) Good home lighting?: yes Regular seat belt use?: yes Hospital stays in the last year:: no  Cognitive Assessment Difficulty concentrating, remembering, or making decisions? : yes Will 6CIT or Mini Cog be Completed: yes What year is it?: 0 points What month is it?: 3 points Give patient an address phrase to remember (5 components): [address] [place], [place] About what time is it?: 3 points Count backwards from 20 to 1: 4 points Say the months of the year in reverse: 4 points Repeat the address phrase from earlier: 10 points 6 CIT Score: 24 points  Advance Directives (For Healthcare) Does Patient Have  a Medical Advance Directive?: Yes Does patient want to make changes to medical advance directive?: No - Patient  declined Type of Advance Directive: Healthcare Power of Attorney Copy of Healthcare Power of Attorney in Chart?: Yes - validated most recent copy scanned in chart (See row information) Copy of Living Will in Chart?: No - copy requested Out of facility DNR (pink MOST or yellow form) in Chart? (Ambulatory ONLY): No - copy requested    Allergies (verified) Patient has no known allergies.   Current Medications (verified) Outpatient Encounter Medications as of 11/29/2024  Medication Sig   atorvastatin  (LIPITOR) 10 MG tablet Take 1 tablet (10 mg total) by mouth at bedtime.   donepezil  (ARICEPT ) 10 MG tablet Take 1 tablet (10 mg total) by mouth every morning.   losartan -hydrochlorothiazide  (HYZAAR ) 50-12.5 MG tablet Take 1 tablet by mouth daily.   potassium chloride  SA (KLOR-CON  M) 20 MEQ tablet Take 1 tablet (20 mEq total) by mouth daily with food.   No facility-administered encounter medications on file as of 11/29/2024.    History: Past Medical History:  Diagnosis Date   Arthritis    Arthritis    Chronic pain    Dementia (HCC)    Dyslipidemia    Essential hypertension    Estrogen deficiency    Gait instability    Hypertension    LTBI (latent tuberculosis infection)    Rotator cuff disorder    right   Uveitis    Past Surgical History:  Procedure Laterality Date   ABDOMINAL HYSTERECTOMY     total   WRIST SURGERY Right    carpel tunnel yrs ago   Family History  Problem Relation Age of Onset   Diabetes Mother    Hypertension Mother    Prostate cancer Father    Other Father        heart surgery   Diabetes Sister    Hypertension Brother    Cancer Brother        in 1 brother, unknown type   Heart attack Paternal Grandmother    Social History   Occupational History    Comment: retired runner, broadcasting/film/video  Tobacco Use   Smoking status: Former    Current packs/day: 0.00    Types: Cigarettes    Quit date: 12/24/1971    Years since quitting: 52.9   Smokeless tobacco: Never   Vaping Use   Vaping status: Never Used  Substance and Sexual Activity   Alcohol use: No    Alcohol/week: 0.0 standard drinks of alcohol   Drug use: No   Sexual activity: Not on file   Tobacco Counseling Counseling given: Not Answered  SDOH Screenings   Food Insecurity: No Food Insecurity (11/29/2024)  Housing: Low Risk  (11/29/2024)  Transportation Needs: Unknown (11/29/2024)  Utilities: Not At Risk (11/29/2024)  Depression (PHQ2-9): Low Risk  (11/29/2024)  Physical Activity: Insufficiently Active (11/29/2024)  Social Connections: Unknown (11/29/2024)  Stress: No Stress Concern Present (11/29/2024)  Tobacco Use: Medium Risk (11/29/2024)  Health Literacy: Inadequate Health Literacy (11/29/2024)   See flowsheets for full screening details  Depression Screen PHQ 2 & 9 Depression Scale- Over the past 2 weeks, how often have you been bothered by any of the following problems? Little interest or pleasure in doing things: 0 Feeling down, depressed, or hopeless (PHQ Adolescent also includes...irritable): 0 PHQ-2 Total Score: 0     Goals Addressed             This Visit's Progress    Patient Stated  Stay healthy             Objective:    There were no vitals filed for this visit. There is no height or weight on file to calculate BMI.  Hearing/Vision screen Hearing Screening - Comments:: No problem with hearing  Vision Screening - Comments:: No problem with vision Immunizations and Health Maintenance Health Maintenance  Topic Date Due   DTaP/Tdap/Td (1 - Tdap) 11/08/2025 (Originally 07/20/1960)   Zoster Vaccines- Shingrix (1 of 2) 02/04/2028 (Originally 07/20/1992)   COVID-19 Vaccine (4 - 2025-26 season) 11/24/2029 (Originally 08/23/2024)   Medicare Annual Wellness (AWV)  11/29/2025   Pneumococcal Vaccine: 50+ Years  Completed   Influenza Vaccine  Completed   Bone Density Scan  Completed   Meningococcal B Vaccine  Aged Out        Assessment/Plan:  This is a  routine wellness examination for Milana.  Patient Care Team: Ngetich, Dinah C, NP as PCP - General (Family Medicine)  I have personally reviewed and noted the following in the patient's chart:   Medical and social history Use of alcohol, tobacco or illicit drugs  Current medications and supplements including opioid prescriptions. Functional ability and status Nutritional status Physical activity Advanced directives List of other physicians Hospitalizations, surgeries, and ER visits in previous 12 months Vitals Screenings to include cognitive, depression, and falls Referrals and appointments  No orders of the defined types were placed in this encounter.  In addition, I have reviewed and discussed with patient certain preventive protocols, quality metrics, and best practice recommendations. A written personalized care plan for preventive services as well as general preventive health recommendations were provided to patient.   Roxan BROCKS Ngetich, NP   11/29/2024   Return in 1 year (on 11/29/2025) for Annual wellness visit.  After Visit Summary: (MyChart) Due to this being a telephonic visit, the after visit summary with patients personalized plan was offered to patient via MyChart   Nurse Notes: Advised to get COVID-19 ,Tdap and Shingles vaccine at the pharmacy

## 2024-11-29 NOTE — Patient Instructions (Signed)
 [name] , Thank you for taking time to come for your Medicare Wellness Visit. I appreciate your ongoing commitment to your health goals. Please review the following plan we discussed and let me know if I can assist you in the future.   Screening recommendations/referrals: Colonoscopy : N/A  Mammogram  Bone Density  Recommended yearly ophthalmology/optometry visit for glaucoma screening and checkup Recommended yearly dental visit for hygiene and checkup  Vaccinations: Influenza vaccine- due annually in September/October Pneumococcal vaccine  Tdap vaccine : Please get vaccine at the pharmacy  Shingles vaccine : Please get vaccine at the pharmacy     Advanced directives: No   Conditions/risks identified:   Next appointment: 1 year    Preventive Care 82 Years and Older, Female Preventive care refers to lifestyle choices and visits with your health care provider that can promote health and wellness. What does preventive care include? A yearly physical exam. This is also called an annual well check. Dental exams once or twice a year. Routine eye exams. Ask your health care provider how often you should have your eyes checked. Personal lifestyle choices, including: Daily care of your teeth and gums. Regular physical activity. Eating a healthy diet. Avoiding tobacco and drug use. Limiting alcohol use. Practicing safe sex. Taking low-dose aspirin  every day. Taking vitamin and mineral supplements as recommended by your health care provider. What happens during an annual well check? The services and screenings done by your health care provider during your annual well check will depend on your age, overall health, lifestyle risk factors, and family history of disease. Counseling  Your health care provider may ask you questions about your: Alcohol use. Tobacco use. Drug use. Emotional well-being. Home and relationship well-being. Sexual activity. Eating habits. History of  falls. Memory and ability to understand (cognition). Work and work astronomer. Reproductive health. Screening  You may have the following tests or measurements: Height, weight, and BMI. Blood pressure. Lipid and cholesterol levels. These may be checked every 5 years, or more frequently if you are over 57 years old. Skin check. Lung cancer screening. You may have this screening every year starting at age 72 if you have a 30-pack-year history of smoking and currently smoke or have quit within the past 15 years. Fecal occult blood test (FOBT) of the stool. You may have this test every year starting at age 64. Flexible sigmoidoscopy or colonoscopy. You may have a sigmoidoscopy every 5 years or a colonoscopy every 10 years starting at age 59. Hepatitis C blood test. Hepatitis B blood test. Sexually transmitted disease (STD) testing. Diabetes screening. This is done by checking your blood sugar (glucose) after you have not eaten for a while (fasting). You may have this done every 1-3 years. Bone density scan. This is done to screen for osteoporosis. You may have this done starting at age 61. Mammogram. This may be done every 1-2 years. Talk to your health care provider about how often you should have regular mammograms. Talk with your health care provider about your test results, treatment options, and if necessary, the need for more tests. Vaccines  Your health care provider may recommend certain vaccines, such as: Influenza vaccine. This is recommended every year. Tetanus, diphtheria, and acellular pertussis (Tdap, Td) vaccine. You may need a Td booster every 10 years. Zoster vaccine. You may need this after age 16. Pneumococcal 13-valent conjugate (PCV13) vaccine. One dose is recommended after age 56. Pneumococcal polysaccharide (PPSV23) vaccine. One dose is recommended after age 12. Talk  to your health care provider about which screenings and vaccines you need and how often you need  them. This information is not intended to replace advice given to you by your health care provider. Make sure you discuss any questions you have with your health care provider. Document Released: 01/05/2016 Document Revised: 08/28/2016 Document Reviewed: 10/10/2015 Elsevier Interactive Patient Education  2017 [employer].  Fall Prevention in the Home Falls can cause injuries. They can happen to people of all ages. There are many things you can do to make your home safe and to help prevent falls. What can I do on the outside of my home? Regularly fix the edges of walkways and driveways and fix any cracks. Remove anything that might make you trip as you walk through a door, such as a raised step or threshold. Trim any bushes or trees on the path to your home. Use bright outdoor lighting. Clear any walking paths of anything that might make someone trip, such as rocks or tools. Regularly check to see if handrails are loose or broken. Make sure that both sides of any steps have handrails. Any raised decks and porches should have guardrails on the edges. Have any leaves, snow, or ice cleared regularly. Use sand or salt on walking paths during winter. Clean up any spills in your garage right away. This includes oil or grease spills. What can I do in the bathroom? Use night lights. Install grab bars by the toilet and in the tub and shower. Do not use towel bars as grab bars. Use non-skid mats or decals in the tub or shower. If you need to sit down in the shower, use a plastic, non-slip stool. Keep the floor dry. Clean up any water that spills on the floor as soon as it happens. Remove soap buildup in the tub or shower regularly. Attach bath mats securely with double-sided non-slip rug tape. Do not have throw rugs and other things on the floor that can make you trip. What can I do in the bedroom? Use night lights. Make sure that you have a light by your bed that is easy to reach. Do not use  any sheets or blankets that are too big for your bed. They should not hang down onto the floor. Have a firm chair that has side arms. You can use this for support while you get dressed. Do not have throw rugs and other things on the floor that can make you trip. What can I do in the kitchen? Clean up any spills right away. Avoid walking on wet floors. Keep items that you use a lot in easy-to-reach places. If you need to reach something above you, use a strong step stool that has a grab bar. Keep electrical cords out of the way. Do not use floor polish or wax that makes floors slippery. If you must use wax, use non-skid floor wax. Do not have throw rugs and other things on the floor that can make you trip. What can I do with my stairs? Do not leave any items on the stairs. Make sure that there are handrails on both sides of the stairs and use them. Fix handrails that are broken or loose. Make sure that handrails are as long as the stairways. Check any carpeting to make sure that it is firmly attached to the stairs. Fix any carpet that is loose or worn. Avoid having throw rugs at the top or bottom of the stairs. If you do have throw rugs,  attach them to the floor with carpet tape. Make sure that you have a light switch at the top of the stairs and the bottom of the stairs. If you do not have them, ask someone to add them for you. What else can I do to help prevent falls? Wear shoes that: Do not have high heels. Have rubber bottoms. Are comfortable and fit you well. Are closed at the toe. Do not wear sandals. If you use a stepladder: Make sure that it is fully opened. Do not climb a closed stepladder. Make sure that both sides of the stepladder are locked into place. Ask someone to hold it for you, if possible. Clearly mark and make sure that you can see: Any grab bars or handrails. First and last steps. Where the edge of each step is. Use tools that help you move around (mobility aids)  if they are needed. These include: Canes. Walkers. Scooters. Crutches. Turn on the lights when you go into a dark area. Replace any light bulbs as soon as they burn out. Set up your furniture so you have a clear path. Avoid moving your furniture around. If any of your floors are uneven, fix them. If there are any pets around you, be aware of where they are. Review your medicines with your doctor. Some medicines can make you feel dizzy. This can increase your chance of falling. Ask your doctor what other things that you can do to help prevent falls. This information is not intended to replace advice given to you by your health care provider. Make sure you discuss any questions you have with your health care provider. Document Released: 10/05/2009 Document Revised: 05/16/2016 Document Reviewed: 01/13/2015 Elsevier Interactive Patient Education  2017 [employer].

## 2024-11-29 NOTE — Progress Notes (Signed)
 I connected with  Sheila Logan on 11/29/24 by a video enabled telemedicine application and verified that I am speaking with the correct person using two identifiers.   I discussed the limitations of evaluation and management by telemedicine. The patient expressed understanding and agreed to proceed.

## 2024-12-20 ENCOUNTER — Other Ambulatory Visit (HOSPITAL_COMMUNITY): Payer: Self-pay

## 2024-12-20 ENCOUNTER — Other Ambulatory Visit: Payer: Self-pay | Admitting: Family

## 2024-12-20 ENCOUNTER — Other Ambulatory Visit: Payer: Self-pay

## 2024-12-20 DIAGNOSIS — I10 Essential (primary) hypertension: Secondary | ICD-10-CM

## 2024-12-20 DIAGNOSIS — E785 Hyperlipidemia, unspecified: Secondary | ICD-10-CM

## 2024-12-20 MED ORDER — LOSARTAN POTASSIUM-HCTZ 50-12.5 MG PO TABS
1.0000 | ORAL_TABLET | Freq: Every day | ORAL | 3 refills | Status: AC
  Filled 2024-12-20: qty 30, 30d supply, fill #0
  Filled 2025-01-18: qty 30, 30d supply, fill #1

## 2024-12-20 MED ORDER — ATORVASTATIN CALCIUM 10 MG PO TABS
10.0000 mg | ORAL_TABLET | Freq: Every evening | ORAL | 3 refills | Status: AC
  Filled 2024-12-20: qty 30, 30d supply, fill #0
  Filled 2025-01-18: qty 30, 30d supply, fill #1

## 2024-12-20 MED ORDER — POTASSIUM CHLORIDE CRYS ER 20 MEQ PO TBCR
20.0000 meq | EXTENDED_RELEASE_TABLET | Freq: Every day | ORAL | 5 refills | Status: AC
  Filled 2024-12-20: qty 30, 30d supply, fill #0
  Filled 2025-01-18: qty 30, 30d supply, fill #1

## 2025-01-18 ENCOUNTER — Other Ambulatory Visit: Payer: Self-pay

## 2025-01-19 ENCOUNTER — Ambulatory Visit (INDEPENDENT_AMBULATORY_CARE_PROVIDER_SITE_OTHER): Admitting: Podiatry

## 2025-01-19 DIAGNOSIS — Z91198 Patient's noncompliance with other medical treatment and regimen for other reason: Secondary | ICD-10-CM

## 2025-01-19 NOTE — Progress Notes (Signed)
 1. Failure to attend appointment with reason given    Rescheduled due to weather.

## 2025-02-15 ENCOUNTER — Ambulatory Visit: Admitting: Podiatry

## 2025-04-20 ENCOUNTER — Ambulatory Visit: Admitting: Podiatry

## 2025-05-09 ENCOUNTER — Ambulatory Visit: Admitting: Family

## 2025-05-24 ENCOUNTER — Ambulatory Visit: Admitting: Podiatry

## 2025-12-05 ENCOUNTER — Ambulatory Visit: Admitting: Family
